# Patient Record
Sex: Female | Born: 1945 | Race: White | Hispanic: No | Marital: Married | Smoking: Never smoker
Health system: Southern US, Community
[De-identification: ages and names within clinical notes are randomized; demographics above are authoritative.]

## PROBLEM LIST (undated history)

## (undated) DIAGNOSIS — T8859XA Other complications of anesthesia, initial encounter: Secondary | ICD-10-CM

## (undated) DIAGNOSIS — H269 Unspecified cataract: Secondary | ICD-10-CM

## (undated) DIAGNOSIS — H6690 Otitis media, unspecified, unspecified ear: Secondary | ICD-10-CM

## (undated) DIAGNOSIS — T4145XA Adverse effect of unspecified anesthetic, initial encounter: Secondary | ICD-10-CM

## (undated) DIAGNOSIS — M1990 Unspecified osteoarthritis, unspecified site: Secondary | ICD-10-CM

## (undated) DIAGNOSIS — K219 Gastro-esophageal reflux disease without esophagitis: Secondary | ICD-10-CM

## (undated) DIAGNOSIS — F329 Major depressive disorder, single episode, unspecified: Secondary | ICD-10-CM

## (undated) DIAGNOSIS — R51 Headache: Secondary | ICD-10-CM

## (undated) DIAGNOSIS — I10 Essential (primary) hypertension: Secondary | ICD-10-CM

## (undated) DIAGNOSIS — F419 Anxiety disorder, unspecified: Secondary | ICD-10-CM

## (undated) DIAGNOSIS — R011 Cardiac murmur, unspecified: Secondary | ICD-10-CM

## (undated) DIAGNOSIS — M48061 Spinal stenosis, lumbar region without neurogenic claudication: Secondary | ICD-10-CM

## (undated) HISTORY — PX: ABDOMINAL HYSTERECTOMY: SHX81

## (undated) HISTORY — PX: JOINT REPLACEMENT: SHX530

## (undated) HISTORY — DX: Unspecified cataract: H26.9

---

## 1993-08-22 HISTORY — PX: ABDOMINAL HYSTERECTOMY: SHX81

## 2006-08-23 HISTORY — PX: JOINT REPLACEMENT: SHX530

## 2006-08-27 IMAGING — CR DG CHEST 2V
2 series · 2 of 2 positions shown · non-contrast
Comparison: none

CLINICAL DATA: Preop osteoarthritis of the knee. 
 CHEST - 2 VIEW ([DATE] HOURS):

[w chest pa]
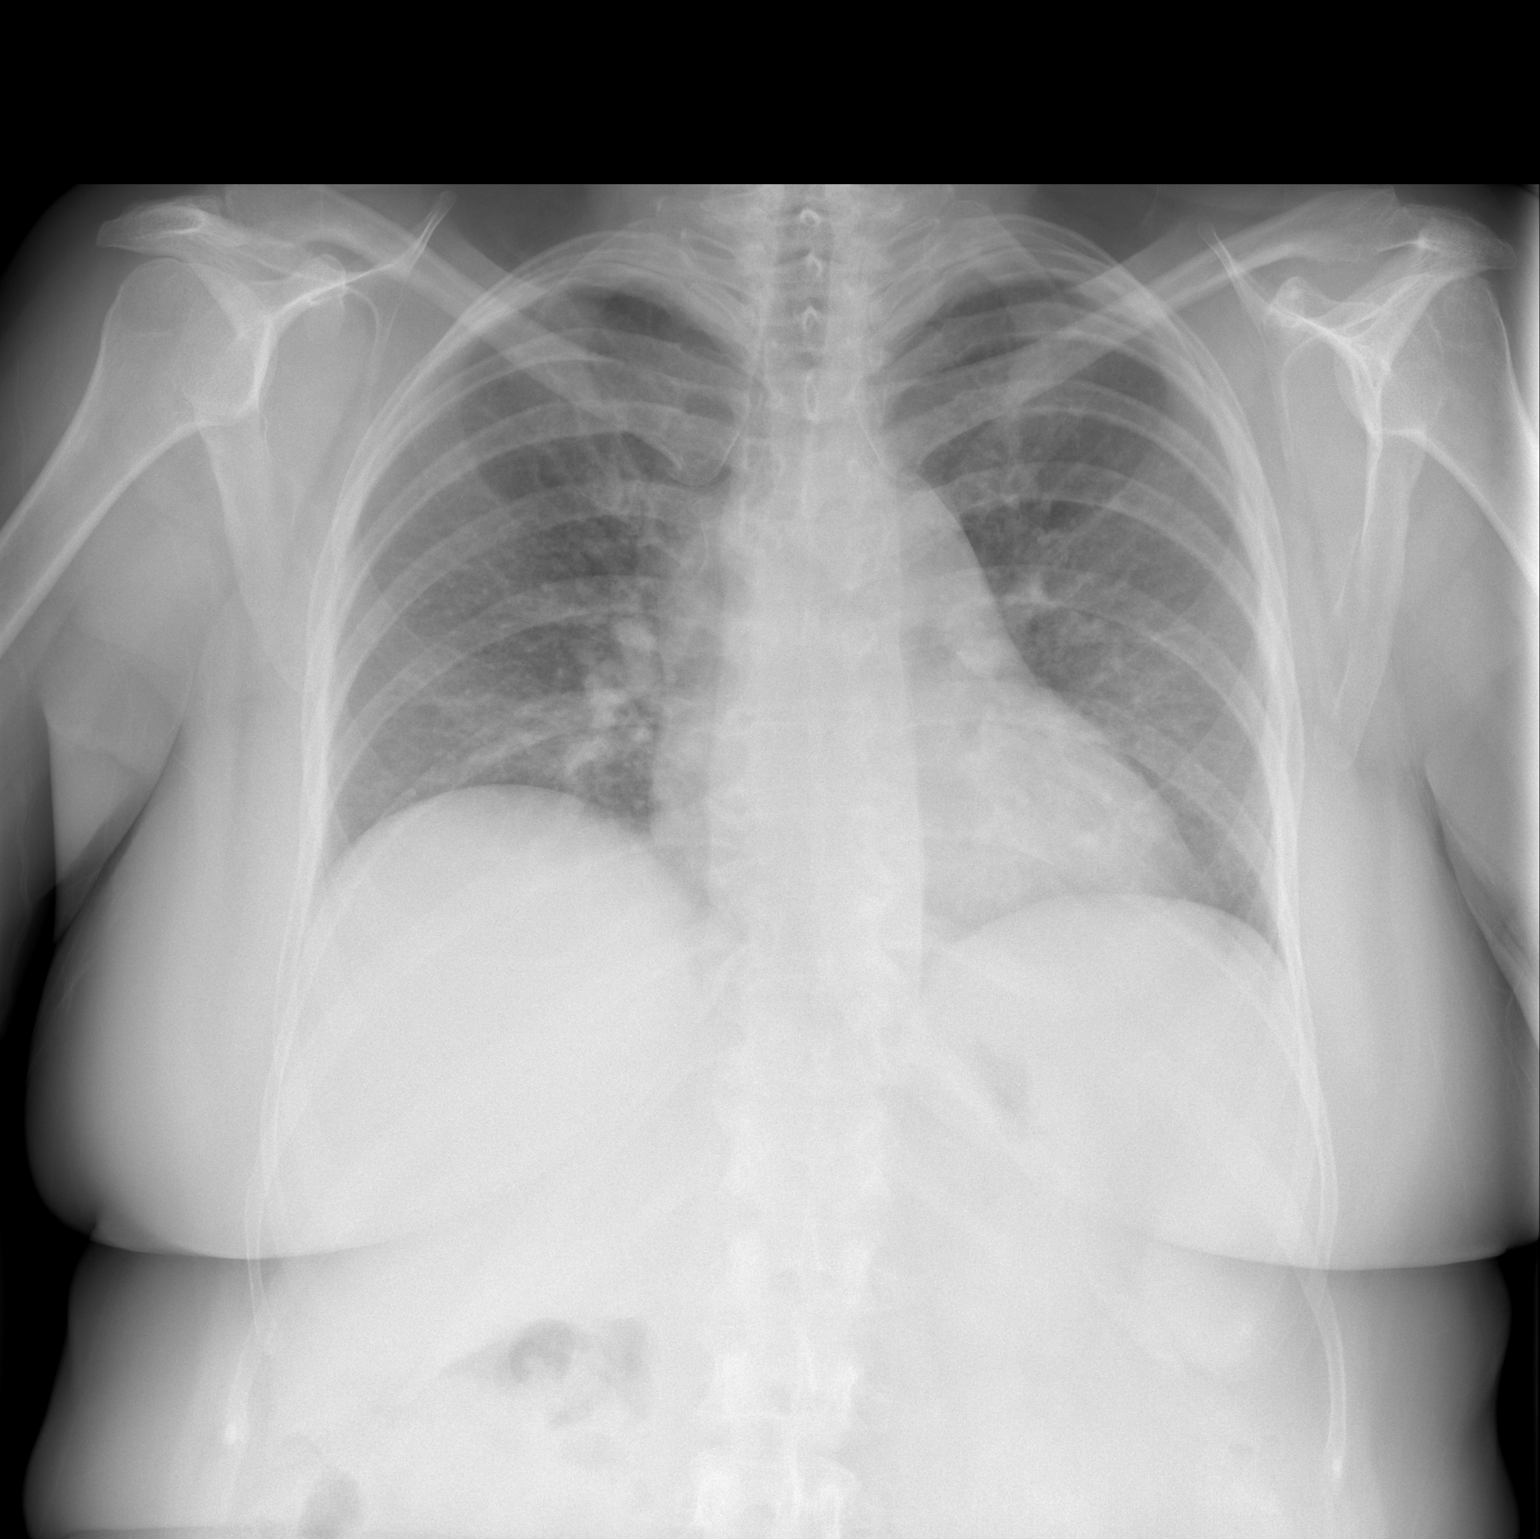

[w chest lat]
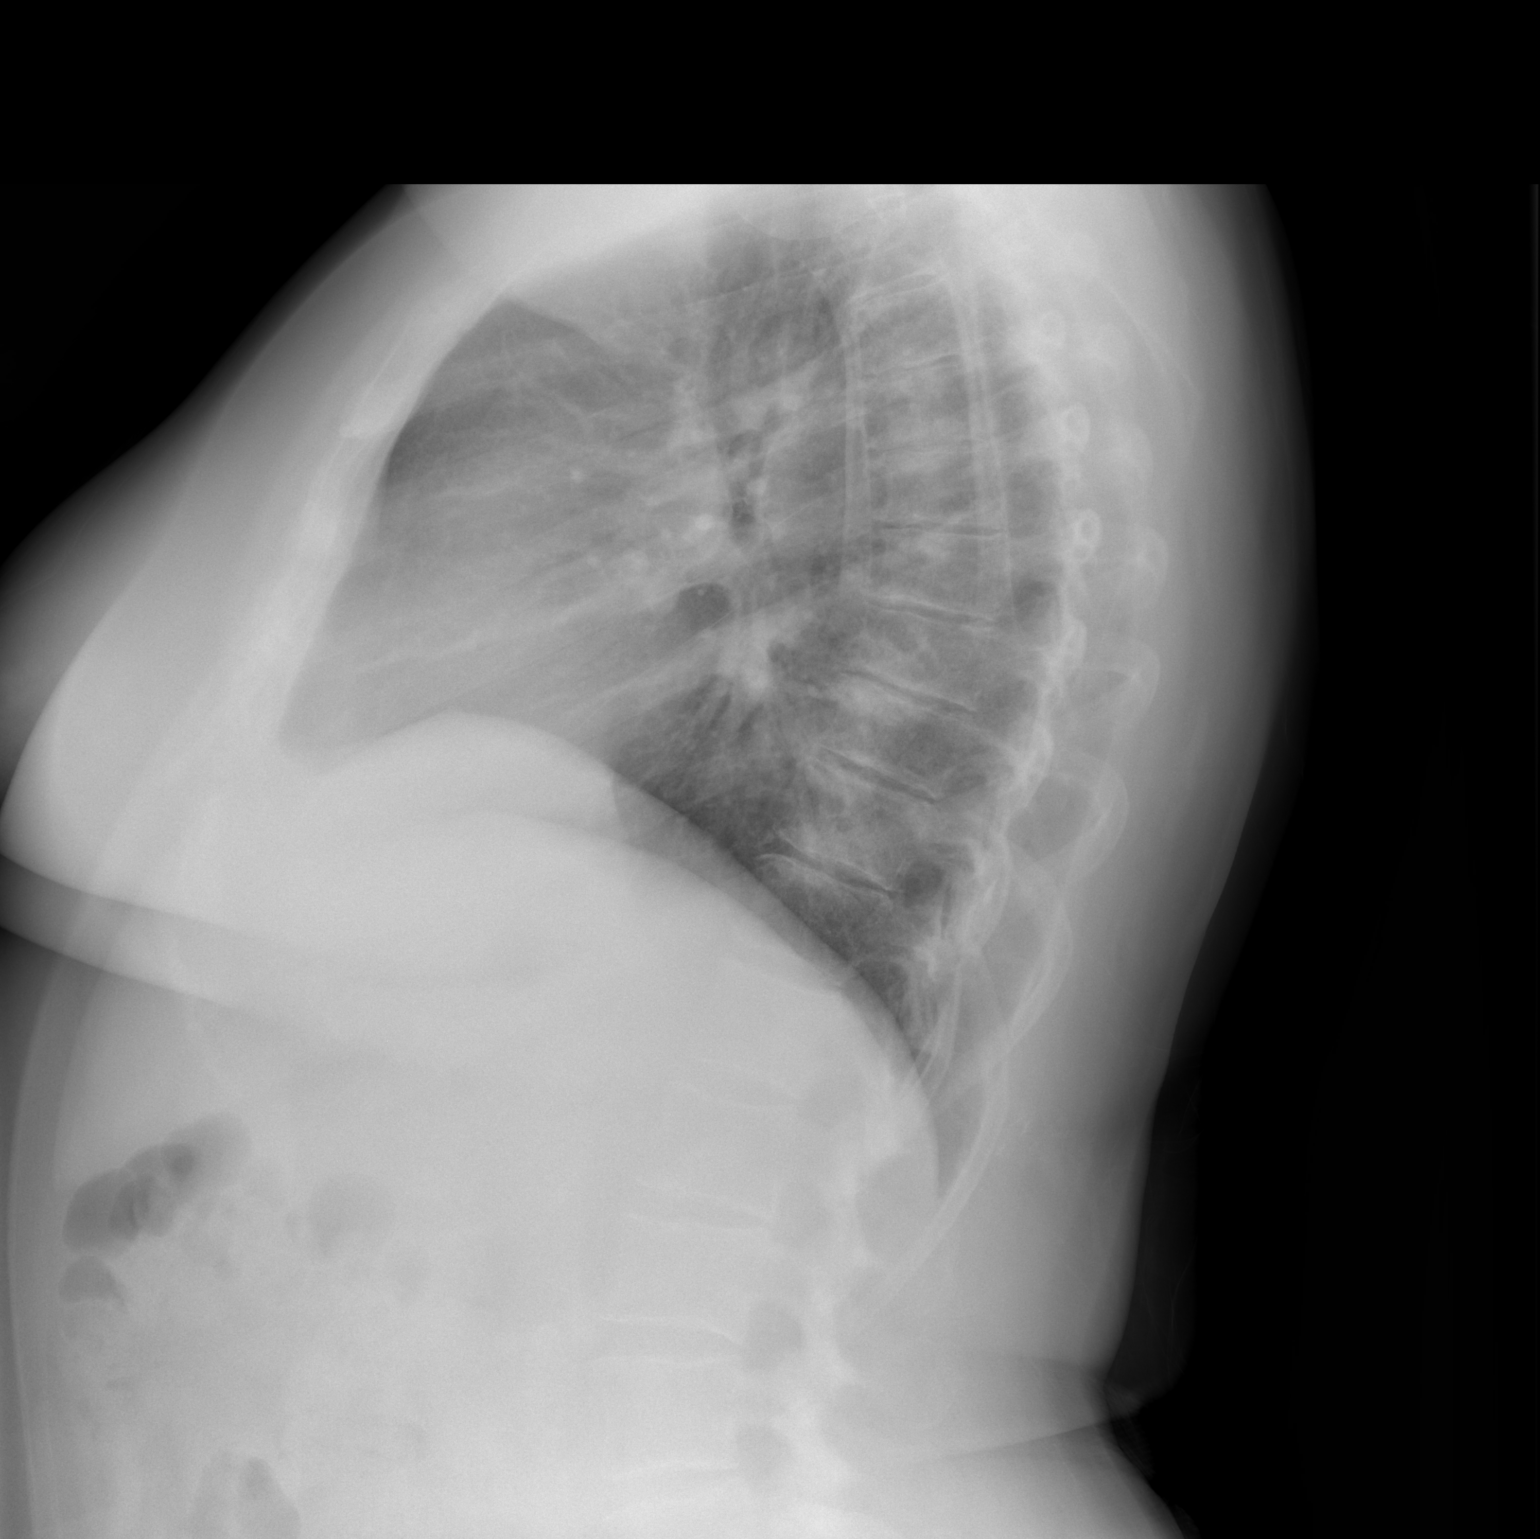

[2 of 2 positions shown; findings below may reference images not displayed]

FINDINGS: Lungs are under inflated and grossly clear.  The heart is mildly enlarged.  Pulmonary vascularity is within normal limits.  No pneumothoraces or effusions are seen.
IMPRESSION: Cardiomegaly without CHF.

## 2006-09-03 ENCOUNTER — Inpatient Hospital Stay (HOSPITAL_COMMUNITY): Admission: RE | Admit: 2006-09-03 | Discharge: 2006-09-10 | Payer: Self-pay | Admitting: Orthopedic Surgery

## 2006-09-04 ENCOUNTER — Ambulatory Visit: Payer: Self-pay | Admitting: Physical Medicine & Rehabilitation

## 2011-11-14 DIAGNOSIS — M48061 Spinal stenosis, lumbar region without neurogenic claudication: Secondary | ICD-10-CM | POA: Diagnosis present

## 2011-11-14 NOTE — H&P (Signed)
Diana Arellano 11/13/2011 11:12 AM [facility]: [facility] Patient #: [id number] DOB: April 22, 1946 Married / Language: English / Race: White Female   History of Present Illness(Diana Demeyer J Marcelline Mates, PA-C; 11/13/2011 11:22 AM) The patient is a 66 year old female who comes in today for a preoperative History and Physical. The patient is scheduled for a L3-L5 decompression with possible interbody fusion L4-5 (for spinal stenosis with slip) to be performed by Dr. Debria Garret D. Shon Baton, Diana Arellano at [facility] on Wednesday, November 29, 2011 at 1130am . Please see the hospital record for complete dictated history and physical.    Allergies(Sharon Gillian Shields; 11/13/2011 11:13 AM) SulfADIAZINE *Sulfonamides** Neosporin *OPHTHALMIC AGENTS* NEOSPORIN. 09/13/2006 (Marked as Inactive) SULFA. 06/21/2006 (Marked as Inactive)   Family History(Sharon Gillian Shields; 11/13/2011 11:13 AM) Cancer. grandmother mothers side Cerebrovascular Accident. grandfather mothers side Diabetes Mellitus. grandmother mothers side Osteoarthritis. mother Severe allergy. father   Social History(Sharon Gillian Shields; 11/13/2011 11:13 AM) Alcohol use. current drinker; drinks beer, wine and hard liquor; 8-14 per week Children. 0 Current work status. retired Financial planner (Currently). no Drug/Alcohol Rehab (Previously). no Exercise. Exercises never; does other Illicit drug use. no Living situation. live with spouse Marital status. married Number of flights of stairs before winded. 2-3 Tobacco / smoke exposure. no Tobacco use. never smoker   Medication History(Sharon Gillian Shields; 11/13/2011 11:14 AM) Omeprazole (40MG  Capsule DR, Oral) Active. FLUoxetine HCl (20MG  Capsule, Oral) Active. Simvastatin (40MG  Tablet, Oral) Active. Aspirin (81MG  Tablet, Oral) Active. Gabapentin (100MG  Capsule, Oral) Active. Methocarbamol (500MG  Tablet, Oral) Active. Lisinopril (20MG  Tablet, Oral)  Active. Hydrocodone-Acetaminophen (10-325MG  Tablet, Oral) Active.   Past Surgical History(Sharon Gillian Shields; 11/13/2011 11:13 AM) Hysterectomy. complete (non-cancerous) Total Knee Replacement. bilateral   Other Problems(Sharon Gillian Shields; 11/13/2011 11:13 AM) Anemia Chronic Cystitis Chronic Pain Gastroesophageal Reflux Disease Heart murmur High blood pressure Hypercholesterolemia Migraine Headache Oophorectomy. bilateral Osteoarthritis Unspecified Diagnosis   Review of Systems(Diana Arellano; 11/13/2011 12:04 PM) General:Present- Weight Gain. Not Present- Chills, Fever, Night Sweats, Appetite Loss, Fatigue, Feeling sick and Weight Loss. Skin:Not Present- Itching, Rash, Skin Color Changes, Ulcer, Psoriasis and Change in Hair or Nails. HEENT:Not Present- Sensitivity to light, Hearing problems, Nose Bleed and Ringing in the Ears. Neck:Not Present- Swollen Glands and Neck Mass. Respiratory:Present- Dyspnea. Not Present- Snoring, Chronic Cough and Bloody sputum. Cardiovascular:Present- Shortness of Breath. Not Present- Chest Pain, Swelling of Extremities, Leg Cramps and Palpitations. Female Genitourinary:Not Present- Blood in Urine, Menstrual Irregularities, Frequency, Incontinence and Nocturia. Musculoskeletal:Present- Back Pain. Not Present- Muscle Weakness, Muscle Pain, Joint Stiffness, Joint Swelling and Joint Pain. Neurological:Not Present- Tingling, Numbness, Burning, Tremor, Headaches and Dizziness. Psychiatric:Present- Depression. Not Present- Anxiety and Memory Loss. Endocrine:Present- Heat Intolerance. Not Present- Cold Intolerance, Excessive hunger and Excessive Thirst. Hematology:Not Present- Abnormal Bleeding, Anemia, Blood Clots and Easy Bruising.   Vitals(Sharon Gillian Shields; 11/13/2011 11:16 AM) 11/13/2011 11:14 AM Weight: 226 lb Height: 77 in Body Surface Area: 2.36 m Body Mass Index: 26.8 kg/m Pulse: 75 (Regular) BP: 143/92 (Sitting,  Left Arm, Standard)    Physical Exam(Diana Tiedt J Alantis Bethune, PA-C; 11/13/2011 4:38 PM) The physical exam findings are as follows:   General General Appearance- pleasant. Not in acute distress. Orientation- Oriented X3. Build & Nutrition- Well nourished and Well developed. Posture- Normal posture. Gait- Normal. Mental Status- Alert.   Integumentary Lumbar Spine- Skin examination of the lumbar spine is without deformity, skin lesions, lacerations or abrasions.   Head and Neck Neck Global Assessment- supple. no lymphadenopathy and no nucchal rigidty.   Eye Pupil-  Bilateral- Normal, Direct reaction to light normal, Equal and Regular. Motion- Bilateral- EOMI.   Chest and Lung Exam Auscultation: Breath sounds:- Clear.   Cardiovascular Auscultation:Rhythm- Regular rate and rhythm. Heart Sounds- Normal heart sounds.   Abdomen Palpation/Percussion:Palpation and Percussion of the abdomen reveal - Non Tender, No Rebound tenderness and Soft.   Peripheral Vascular Lower Extremity:Inspection- Bilateral- Inspection Normal. Palpation:Posterior tibial pulse- Bilateral- 2+. Dorsalis pedis pulse- Bilateral- 2+.   Neurologic Sensation:Lower Extremity- Bilateral- sensation is intact in the lower extremity. Reflexes:Patellar Reflex- Bilateral- 0 (unequivocal). Achilles Reflex- Bilateral- 1+. Babinski- Bilateral- Babinski not present. Clonus- Bilateral- clonus not present. Testing:Seated Straight Leg Raise- Bilateral- Seated straight leg raise negative.   Musculoskeletal Spine/Ribs/Pelvis Lumbosacral Spine:Inspection and Palpation- Tenderness- generalized. bony and soft tissue palpation of the lumbar spine and SI joint does not recreate their typical pain. Strength and Tone: Strength:Hip Flexion- Bilateral- 5/5. Knee Extension- Bilateral- 5/5. Knee Flexion- Bilateral- 5/5. Ankle Dorsiflexion- Bilateral- 5/5. Ankle  Plantarflexion- Bilateral- 5/5. Heel walk- Bilateral- able to heel walk without difficulty. Toe Walk- Bilateral- able to walk on toes without difficulty. Heel-Toe Walk- Bilateral- able to heel-toe walk without difficulty. ROM- Flexion- mildly decreased range of motion and painful. Extension- mildly decreased range of motion and painful. Pain:- neither flexion or extension is more painful than the other. Waddell's Signs- no Waddell's signs present. Lower Extremity Range of Motion:- No true hip, knee or ankle pain with range of motion. Gait and Station:Assistive Devices- no assistive devices.   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 Risks/benefits/alternatives to the procedure/expectations following the procedure have been reviewed with her by Dr. Shon Baton. She understands.  The MRI shows moderate to severe stenosis at L3-4 as well as at L4-5 with a grade 1 borderline 2 slip. L5-S1 has a slight anterolisthesis with severe facet arthrosis, right side is worse , but this does not cause neural displacement.  She has not yet completed her pre-op hospital requirements and is scheduled to do so on 11/21/11. She has be fitted for a lumbar corsett brace with our therapy department and she knows to bring this with her the morning of surgery. She has been medically cleared for surgery by Dr. Collins Scotland (please see the scanned documentation in the office chart for the specifics).  All of her questions have been encouraged, addressed and answered. Plan, at this time is to proceed with surgery as  planned.   Signed electronically by Gwinda Maine, PA-C (11/13/2011 4:39 PM)   Diana Arellano 10/16/2011 2:21 PM Location[facility] Patient #: [id number] DOB: June 22, 1945 Married / Language: [place] / Race: White Female   History of Present Illness(Diana Arellano; 10/16/2011 2:23 PM) The patient is a 66 year old female who presents today for follow up of their back. The patient is being followed for their central back pain. Symptoms reported today include: pain (about the lower lumbar radiating into bilat. lower ext. to the level of the knee/posteriorly.), while the patient does not report symptoms of: weakness or numbness. The patient states that they are doing poorly. Current treatment includes: relative rest, activity modification and pain medications. The following medication has been used for pain control: Hydrocodone. The  patient presents today following MRI.    Subjective Transcription(Diana D BROOKS, Diana Arellano; 10/20/2011 12:32 PM)  She and her husband return for a followup of her MRI . She still has significant low back pain and buttock pain as well as bilateral leg pain. The right is always worse than the left.    Allergies(Diana Arellano; 10/16/2011 2:23 PM) SulfADIAZINE *Sulfonamides** Neosporin *OPHTHALMIC AGENTS* NEOSPORIN. 09/13/2006 (Marked as Inactive) SULFA. 06/21/2006 (Marked as Inactive)   Social History(Diana Arellano; 10/16/2011 2:24 PM) Tobacco use. never smoker   Medication History(Diana Arellano; 10/16/2011 2:24 PM) Hydrocodone-Acetaminophen (10-325MG  Tablet, Oral) Active.   Objective Transcription(Diana D BROOKS, Diana Arellano; 10/20/2011 12:32 PM)  RADIOGRAPHS:  The MRI shows moderate to severe stenosis at L3-4 as well as at L4-5 with a grade 1 borderline 2 slip. L5-S1 has a slight anterolisthesis with severe facet arthrosis, right side is worse , but this does not cause neural displacement.    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 However, with the new MRI I do think the stenosis at L3-4 will also need to be addressed. Therefore, I would recommend more of a central decompression at L3-4, L4-5 and a partial decompression at L5-S1 to address that right facet arthrosis and instrumented fusion at L4-5 to keep the slips from intensifying. This can be accomplished with pedicle screw fixation and possibly interbody fixation if necessary.    I have explained this to the patient and her husband and they are area of the procedure and the indication. They would like to proceed. We will get preoperative medical clearance from her primary care physician. We will proceed at some point next month.    I did review the risk with him to include infection, bleeding, nerve damage, death , stroke, paralysis, blood clots, ongoing or worse pain, loss of bowel and/or bladder control and need for further surgery. The goal of surgery is reduction in her pain and not elimination, to reduce her to a more palatable level of discomfort so her overall quality of life improves. All questions were encouraged and answered. She is expressing understanding of the procedure and the risks.    Miscellaneous Transcription(Diana Sheela Stack, Diana Arellano; 10/20/2011 12:32 PM)  Debria Garret D. Shon Baton, Diana Arellano/jgc    T: 10-18-11  D: 10-16-11    Signed electronically by Alvy Beal, Diana Arellano (10/17/2011 4:40 PM)

## 2011-11-16 ENCOUNTER — Encounter (HOSPITAL_COMMUNITY): Payer: Self-pay | Admitting: Pharmacy Technician

## 2011-11-21 ENCOUNTER — Encounter (HOSPITAL_COMMUNITY): Payer: Self-pay

## 2011-11-21 ENCOUNTER — Encounter (HOSPITAL_COMMUNITY)
Admission: RE | Admit: 2011-11-21 | Discharge: 2011-11-21 | Disposition: A | Discharge status: Home / Self Care | Payer: Medicare Other | Source: Ambulatory Visit | Attending: Physician Assistant | Admitting: Physician Assistant

## 2011-11-21 ENCOUNTER — Encounter (HOSPITAL_COMMUNITY)
Admission: RE | Admit: 2011-11-21 | Discharge: 2011-11-21 | Disposition: A | Discharge status: Home / Self Care | Payer: Medicare Other | Source: Ambulatory Visit | Attending: Orthopedic Surgery | Admitting: Orthopedic Surgery

## 2011-11-21 DIAGNOSIS — M51379 Other intervertebral disc degeneration, lumbosacral region without mention of lumbar back pain or lower extremity pain: Secondary | ICD-10-CM | POA: Insufficient documentation

## 2011-11-21 DIAGNOSIS — Z01810 Encounter for preprocedural cardiovascular examination: Secondary | ICD-10-CM | POA: Insufficient documentation

## 2011-11-21 DIAGNOSIS — M5137 Other intervertebral disc degeneration, lumbosacral region: Secondary | ICD-10-CM | POA: Insufficient documentation

## 2011-11-21 DIAGNOSIS — Z01812 Encounter for preprocedural laboratory examination: Secondary | ICD-10-CM | POA: Insufficient documentation

## 2011-11-21 DIAGNOSIS — Z01818 Encounter for other preprocedural examination: Secondary | ICD-10-CM | POA: Insufficient documentation

## 2011-11-21 DIAGNOSIS — M5126 Other intervertebral disc displacement, lumbar region: Secondary | ICD-10-CM | POA: Insufficient documentation

## 2011-11-21 HISTORY — DX: Major depressive disorder, single episode, unspecified: F32.9

## 2011-11-21 HISTORY — DX: Anxiety disorder, unspecified: F41.9

## 2011-11-21 HISTORY — DX: Unspecified osteoarthritis, unspecified site: M19.90

## 2011-11-21 HISTORY — DX: Essential (primary) hypertension: I10

## 2011-11-21 HISTORY — DX: Adverse effect of unspecified anesthetic, initial encounter: T41.45XA

## 2011-11-21 HISTORY — DX: Gastro-esophageal reflux disease without esophagitis: K21.9

## 2011-11-21 HISTORY — DX: Other complications of anesthesia, initial encounter: T88.59XA

## 2011-11-21 LAB — COMPREHENSIVE METABOLIC PANEL
ALT: 25 U/L (ref 0–35)
AST: 24 U/L (ref 0–37)
Albumin: 4 g/dL (ref 3.5–5.2)
Alkaline Phosphatase: 120 U/L — ABNORMAL HIGH (ref 39–117)
BUN: 19 mg/dL (ref 6–23)
CO2: 28 mEq/L (ref 19–32)
Calcium: 9.6 mg/dL (ref 8.4–10.5)
Chloride: 102 mEq/L (ref 96–112)
Creatinine, Ser: 0.76 mg/dL (ref 0.50–1.10)
GFR calc Af Amer: >90 mL/min (ref >90)
GFR calc non Af Amer: 87 mL/min — ABNORMAL LOW (ref >90)
Glucose, Bld: 95 mg/dL (ref 70–99)
Potassium: 3.9 mEq/L (ref 3.5–5.1)
Sodium: 140 mEq/L (ref 135–145)
Total Bilirubin: 0.4 mg/dL (ref 0.3–1.2)
Total Protein: 6.9 g/dL (ref 6.0–8.3)

## 2011-11-21 LAB — PROTIME-INR
INR: 1.1 (ref 0.00–1.49)
Prothrombin Time: 14.4 seconds (ref 11.6–15.2)

## 2011-11-21 LAB — TYPE AND SCREEN
ABO/RH(D): A POS
Antibody Screen: NEGATIVE

## 2011-11-21 LAB — CBC
HCT: 38.6 % (ref 36.0–46.0)
Hemoglobin: 13.1 g/dL (ref 12.0–15.0)
MCH: 31.3 pg (ref 26.0–34.0)
MCHC: 33.9 g/dL (ref 30.0–36.0)
MCV: 92.3 fL (ref 78.0–100.0)
Platelets: 151 10*3/uL (ref 150–400)
RBC: 4.18 MIL/uL (ref 3.87–5.11)
RDW: 13 % (ref 11.5–15.5)
WBC: 5.7 10*3/uL (ref 4.0–10.5)

## 2011-11-21 LAB — SURGICAL PCR SCREEN
MRSA, PCR: NEGATIVE
Staphylococcus aureus: NEGATIVE

## 2011-11-21 LAB — ABO/RH: ABO/RH(D): A POS

## 2011-11-21 IMAGING — CR DG LUMBAR SPINE 2-3V
2 series · 2 of 2 positions shown · non-contrast
Comparison: None available

CLINICAL DATA: Preop for lumbar laminectomy

LUMBAR SPINE - 2-3 VIEW

[view not recorded (1 of 2)]
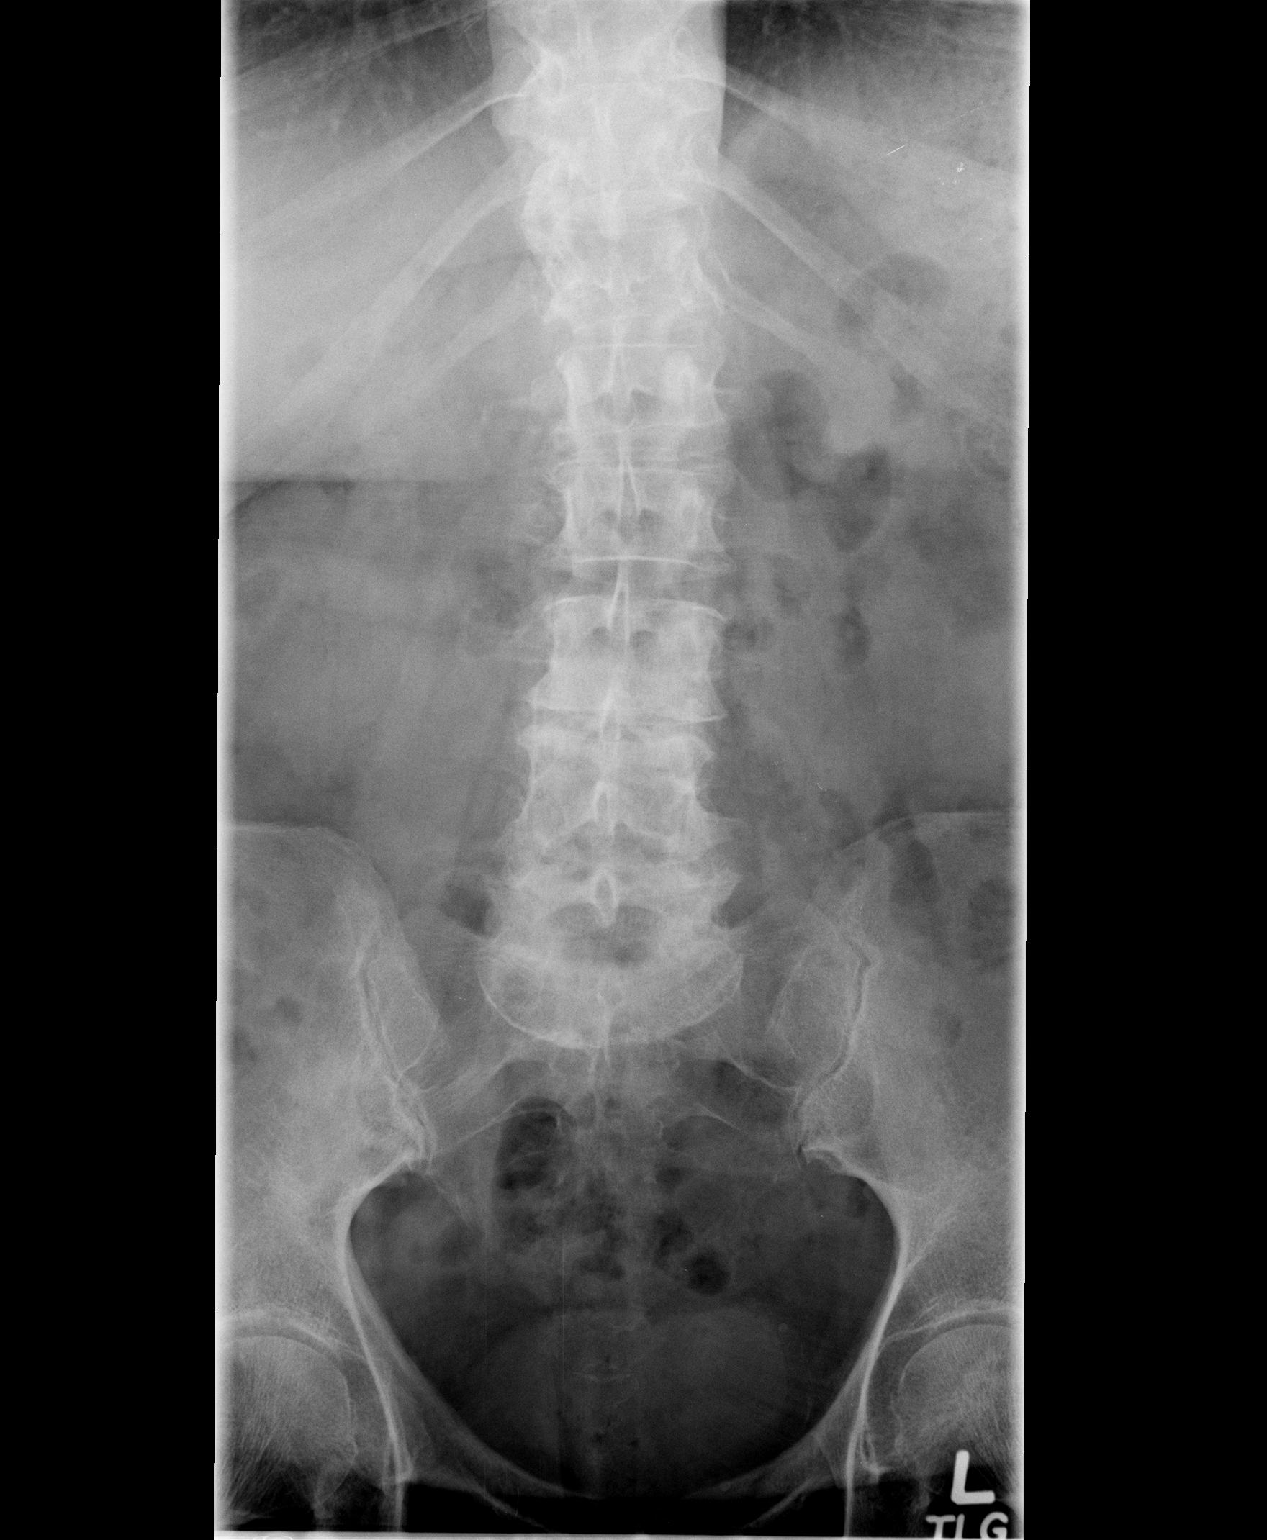

[view not recorded (2 of 2)]
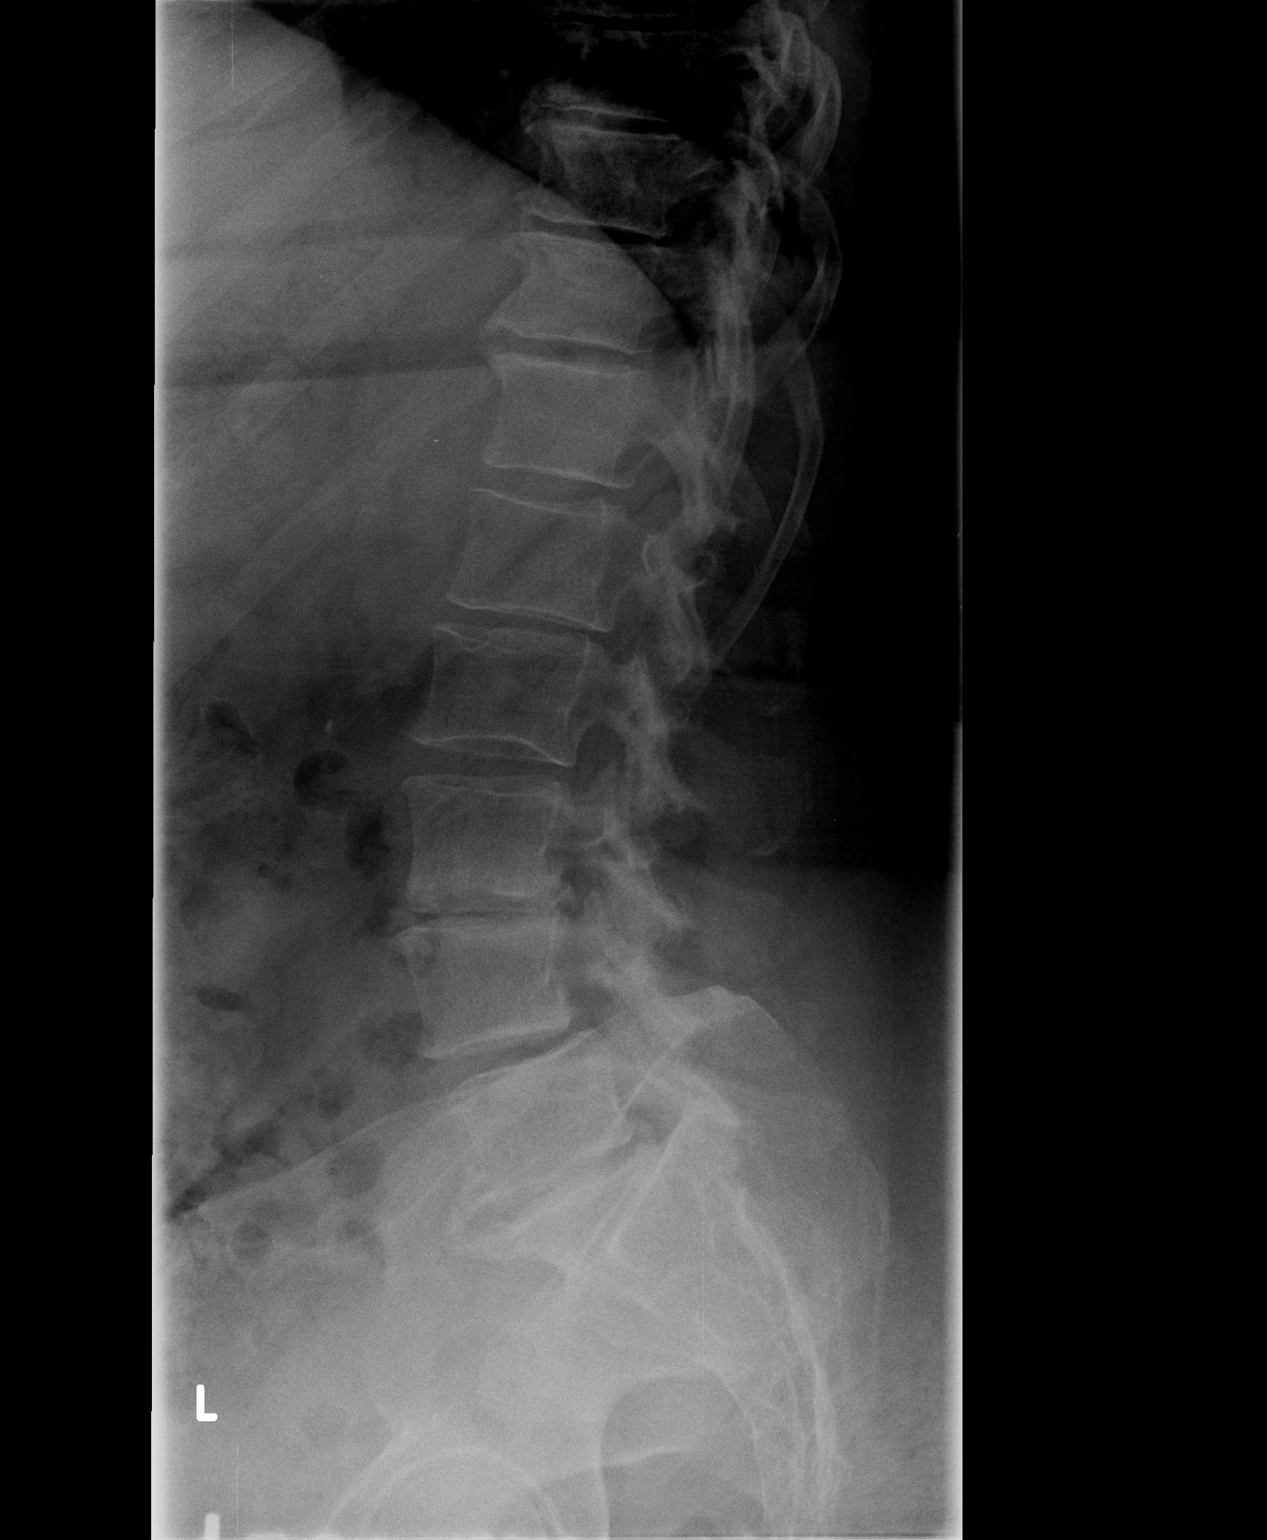

[2 of 2 positions shown; findings below may reference images not displayed]

FINDINGS: There are five non-rib bearing lumbar-type vertebral
bodies.  Vertebral body levels are labeled on these images.

There is approximately 7 mm anterolisthesis of L4 on L5.  Remainder
of the vertebral bodies are normally aligned.  Vertebral body
heights are maintained.  There is disc space narrowing at L3-4 and
L5-S1. No obvious pars defects on this two-view study; evaluation
for pars defects is limited without oblique views.

Anterior osteophyte formation is present at L5-S1.  Facet joint
degenerative changes are seen at L2-L5.  Sacroiliac joints appear
normal.  No acute bony abnormality.
IMPRESSION: 1. Degenerative disc disease, most prominent at L3-4 and L5-S1 and
multilevel facet joint degenerative change.
 2.  Grade 1 anterolisthesis of L4 on L5.

## 2011-11-21 NOTE — Progress Notes (Signed)
Ekg,cxr req. From dr Yehuda Budd

## 2011-11-21 NOTE — Pre-Procedure Instructions (Signed)
20 Diana Arellano  11/21/2011   Your procedure is scheduled on:  11/29/11  Report to [facility] at 930 AM.  Call this number if you have problems the morning of surgery: [phone]   Remember:   Do not eat food:After Midnight.  May have clear liquids:until Midnight .  Clear liquids include soda, tea, black coffee, apple or grape juice, broth.  Take these medicines the morning of surgery with A SIP OF WATER: prozac,neurotin,hydrocodone,robaxan,prilosec   Do not wear jewelry, make-up or nail polish.  Do not wear lotions, powders, or perfumes. You may wear deodorant.  Do not shave 48 hours prior to surgery. Men may shave face and neck.  Do not bring valuables to the hospital.  Contacts, dentures or bridgework may not be worn into surgery.  Leave suitcase in the car. After surgery it may be brought to your room.  For patients admitted to the hospital, checkout time is 11:00 AM the day of discharge.   Patients discharged the day of surgery will not be allowed to drive home.  Name and phone number of your driver: family  Special Instructions: CHG Shower Use Special Wash: 1/2 bottle night before surgery and 1/2 bottle morning of surgery.   Please read over the following fact sheets that you were given: Pain Booklet, Coughing and Deep Breathing, Blood Transfusion Information, MRSA Information and Surgical Site Infection Prevention

## 2011-11-28 MED ORDER — CEFAZOLIN SODIUM-DEXTROSE 2-3 GM-% IV SOLR
2.0000 g | INTRAVENOUS | Status: AC
  Administered 2011-12-28 (×2): 2 g via INTRAVENOUS

## 2011-11-28 MED ORDER — LACTATED RINGERS IV SOLN: INTRAVENOUS | Status: DC

## 2011-12-14 ENCOUNTER — Encounter (HOSPITAL_COMMUNITY): Payer: Self-pay

## 2011-12-14 ENCOUNTER — Encounter (HOSPITAL_COMMUNITY)
Admission: RE | Admit: 2011-12-14 | Discharge: 2011-12-14 | Disposition: A | Discharge status: Home / Self Care | Payer: Medicare Other | Source: Ambulatory Visit | Attending: Orthopedic Surgery | Admitting: Orthopedic Surgery

## 2011-12-14 DIAGNOSIS — M4316 Spondylolisthesis, lumbar region: Secondary | ICD-10-CM

## 2011-12-14 HISTORY — DX: Headache: R51

## 2011-12-14 HISTORY — DX: Cardiac murmur, unspecified: R01.1

## 2011-12-14 LAB — CBC
HCT: 40.5 % (ref 36.0–46.0)
Hemoglobin: 13.8 g/dL (ref 12.0–15.0)
MCH: 31.2 pg (ref 26.0–34.0)
MCHC: 34.1 g/dL (ref 30.0–36.0)
MCV: 91.6 fL (ref 78.0–100.0)
Platelets: 157 10*3/uL (ref 150–400)
RBC: 4.42 MIL/uL (ref 3.87–5.11)
RDW: 13 % (ref 11.5–15.5)
WBC: 6.3 10*3/uL (ref 4.0–10.5)

## 2011-12-14 LAB — COMPREHENSIVE METABOLIC PANEL
ALT: 23 U/L (ref 0–35)
AST: 27 U/L (ref 0–37)
Albumin: 4 g/dL (ref 3.5–5.2)
Alkaline Phosphatase: 124 U/L — ABNORMAL HIGH (ref 39–117)
BUN: 14 mg/dL (ref 6–23)
CO2: 30 mEq/L (ref 19–32)
Calcium: 9.8 mg/dL (ref 8.4–10.5)
Chloride: 103 mEq/L (ref 96–112)
Creatinine, Ser: 0.84 mg/dL (ref 0.50–1.10)
GFR calc Af Amer: 83 mL/min — ABNORMAL LOW (ref >90)
GFR calc non Af Amer: 71 mL/min — ABNORMAL LOW (ref >90)
Glucose, Bld: 102 mg/dL — ABNORMAL HIGH (ref 70–99)
Potassium: 4.3 mEq/L (ref 3.5–5.1)
Sodium: 142 mEq/L (ref 135–145)
Total Bilirubin: 0.4 mg/dL (ref 0.3–1.2)
Total Protein: 7.2 g/dL (ref 6.0–8.3)

## 2011-12-14 LAB — PROTIME-INR
INR: 1.11 (ref 0.00–1.49)
Prothrombin Time: 14.5 seconds (ref 11.6–15.2)

## 2011-12-14 LAB — TYPE AND SCREEN
ABO/RH(D): A POS
Antibody Screen: NEGATIVE

## 2011-12-14 NOTE — Progress Notes (Addendum)
Contacted Dr. Alda Berthold office [phone], spoke with Casimer Bilis requested most recent EKG and CXR.

## 2011-12-14 NOTE — Pre-Procedure Instructions (Signed)
20 Diana Arellano  12/14/2011   Your procedure is scheduled on:  Thursday December 21, 2011  Report to [facility] at 8:30 AM.  Call this number if you have problems the morning of surgery: [phone]   Remember:   Do not eat food or drink:After Midnight.      Take these medicines the morning of surgery with A SIP OF WATER: prozac, gabapentin, norco, methocarbamol, prilosec,   Do not wear jewelry, make-up or nail polish.  Do not wear lotions, powders, or perfumes. You may wear deodorant.  Do not shave 48 hours prior to surgery. Men may shave face and neck.  Do not bring valuables to the hospital.  Contacts, dentures or bridgework may not be worn into surgery.  Leave suitcase in the car. After surgery it may be brought to your room.  For patients admitted to the hospital, checkout time is 11:00 AM the day of discharge.   Patients discharged the day of surgery will not be allowed to drive home.  Name and phone number of your driver: Avie Echevaria, husband  Special Instructions: CHG Shower Use Special Wash: 1/2 bottle night before surgery and 1/2 bottle morning of surgery.   Please read over the following fact sheets that you were given: Pain Booklet, Coughing and Deep Breathing, Blood Transfusion Information, MRSA Information and Surgical Site Infection Prevention

## 2011-12-24 HISTORY — PX: BACK SURGERY: SHX140

## 2011-12-27 MED ORDER — CEFAZOLIN SODIUM-DEXTROSE 2-3 GM-% IV SOLR: 2.0000 g | INTRAVENOUS | Status: DC

## 2011-12-27 NOTE — Progress Notes (Signed)
I spoke with Diana Arellano, she denies any change in health history since PAT visit. Pt states she is still wearing blood bank bracelet.  I instructed pat of arrival time of 0530 and NPO after midnight and what medications she is allowed to take in the am.

## 2011-12-28 ENCOUNTER — Encounter (HOSPITAL_COMMUNITY): Payer: Self-pay | Admitting: *Deleted

## 2011-12-28 ENCOUNTER — Inpatient Hospital Stay (HOSPITAL_COMMUNITY)
Admission: RE | Admit: 2011-12-28 | Discharge: 2011-12-31 | DRG: 460 | Disposition: A | Discharge status: Home / Self Care | Payer: Medicare Other | Source: Ambulatory Visit | Attending: Orthopedic Surgery | Admitting: Orthopedic Surgery

## 2011-12-28 ENCOUNTER — Encounter (HOSPITAL_COMMUNITY)
Admission: RE | Disposition: A | Discharge status: Home / Self Care | Payer: Self-pay | Source: Ambulatory Visit | Attending: Orthopedic Surgery

## 2011-12-28 ENCOUNTER — Inpatient Hospital Stay (HOSPITAL_COMMUNITY): Payer: Medicare Other | Admitting: Anesthesiology

## 2011-12-28 ENCOUNTER — Encounter (HOSPITAL_COMMUNITY): Payer: Self-pay | Admitting: Anesthesiology

## 2011-12-28 ENCOUNTER — Inpatient Hospital Stay (HOSPITAL_COMMUNITY): Payer: Medicare Other

## 2011-12-28 DIAGNOSIS — F329 Major depressive disorder, single episode, unspecified: Secondary | ICD-10-CM | POA: Diagnosis present

## 2011-12-28 DIAGNOSIS — F411 Generalized anxiety disorder: Secondary | ICD-10-CM | POA: Diagnosis present

## 2011-12-28 DIAGNOSIS — F3289 Other specified depressive episodes: Secondary | ICD-10-CM | POA: Diagnosis present

## 2011-12-28 DIAGNOSIS — M4310 Spondylolisthesis, site unspecified: Secondary | ICD-10-CM | POA: Diagnosis present

## 2011-12-28 DIAGNOSIS — Z01811 Encounter for preprocedural respiratory examination: Secondary | ICD-10-CM

## 2011-12-28 DIAGNOSIS — M48061 Spinal stenosis, lumbar region without neurogenic claudication: Principal | ICD-10-CM | POA: Diagnosis present

## 2011-12-28 DIAGNOSIS — I10 Essential (primary) hypertension: Secondary | ICD-10-CM | POA: Diagnosis present

## 2011-12-28 DIAGNOSIS — Z23 Encounter for immunization: Secondary | ICD-10-CM

## 2011-12-28 DIAGNOSIS — K219 Gastro-esophageal reflux disease without esophagitis: Secondary | ICD-10-CM | POA: Diagnosis present

## 2011-12-28 DIAGNOSIS — Z01812 Encounter for preprocedural laboratory examination: Secondary | ICD-10-CM

## 2011-12-28 DIAGNOSIS — M4316 Spondylolisthesis, lumbar region: Secondary | ICD-10-CM

## 2011-12-28 LAB — SURGICAL PCR SCREEN
MRSA, PCR: NEGATIVE
Staphylococcus aureus: NEGATIVE

## 2011-12-28 IMAGING — RF DG C-ARM GT 120 MIN
1 series · 3 of 3 positions shown · non-contrast
Comparison: Fluoroscopy time:  2.37 minutes

CLINICAL DATA: Posterior lumbar spine fusion and decompression.

DG C-ARM GT 120 MIN

[Series 1: run · 3 of 3 slices shown]
[im 1/3]
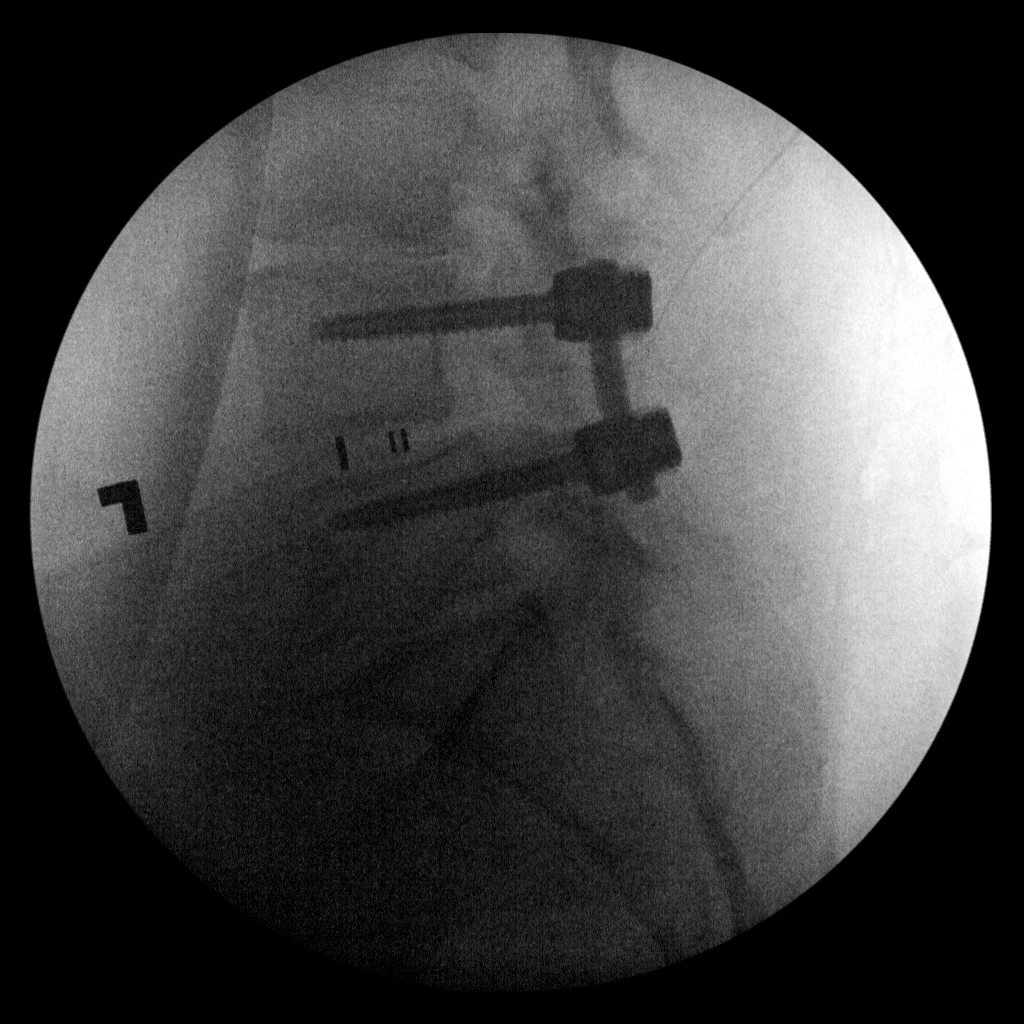
[im 2/3]
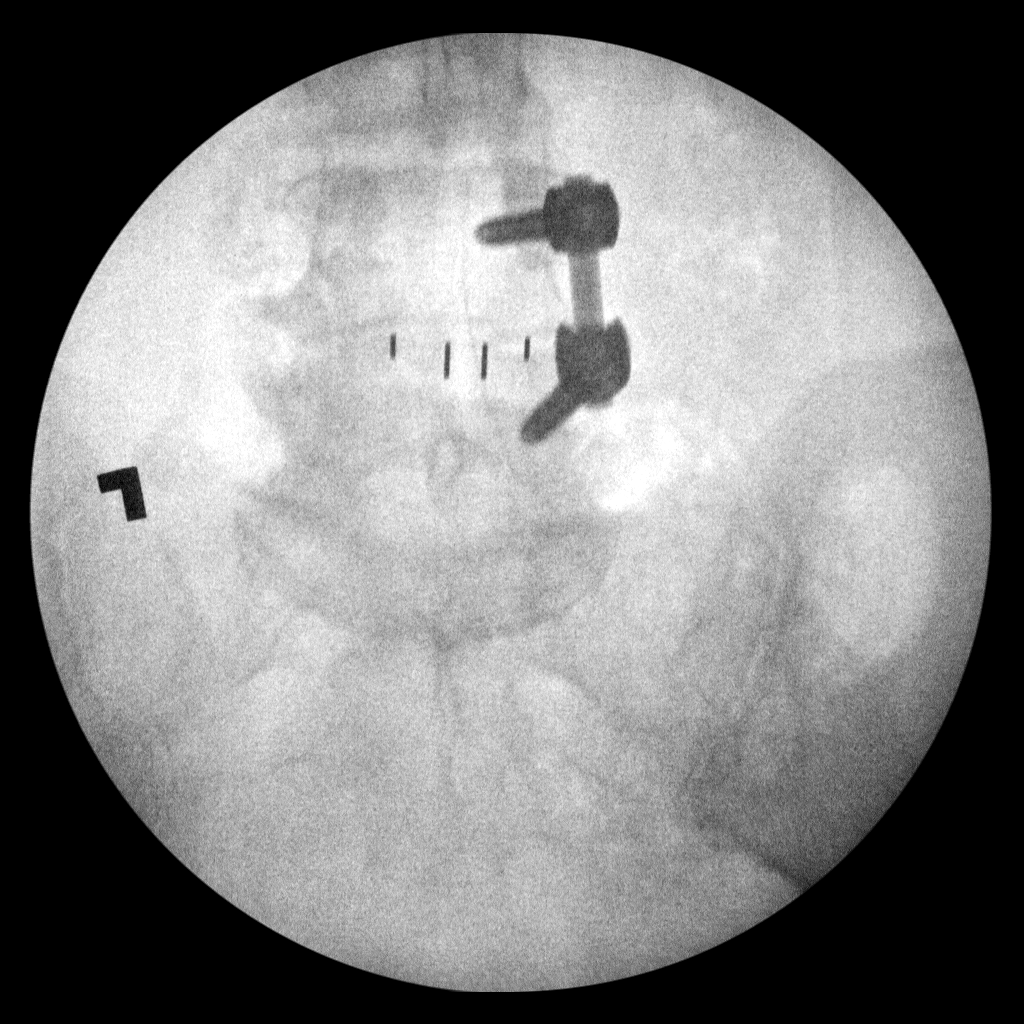
[im 3/3]
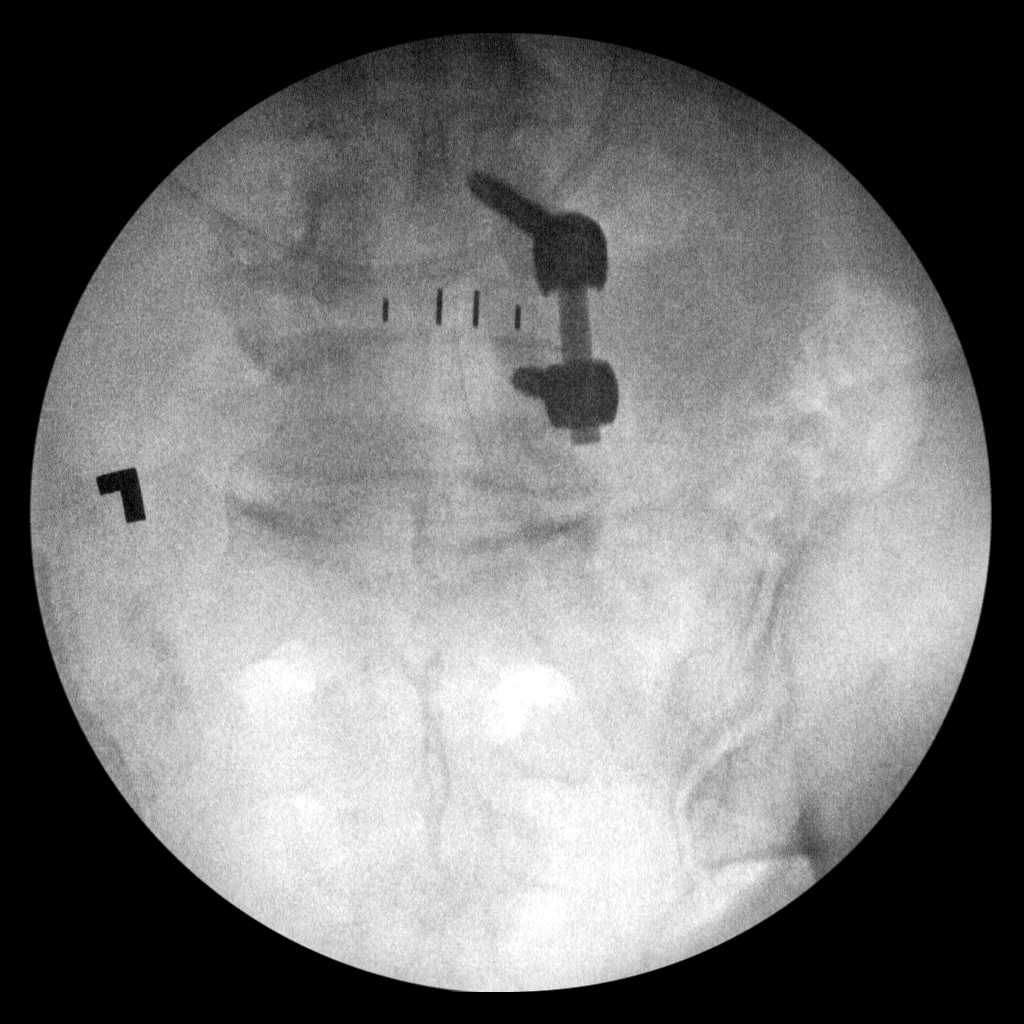

[3 of 3 positions shown; findings below may reference images not displayed]

FINDINGS: Right-sided pedicle screws and a single interconnecting
rod fuse L4-L5, with a disc spacer well-centered between the L4-L5
vertebrae partly maintaining disc height.  The orthopedic hardware
is well seated and aligned.  There is a 5 mm anterolisthesis of L4
on L5. Disc degenerative changes are also noted at L3-L4 and L5-S1.

No evidence of an operative complication.

## 2011-12-28 SURGERY — POSTERIOR LUMBAR FUSION 2 LEVEL
Anesthesia: General | Site: Spine Lumbar | Wound class: Clean

## 2011-12-28 MED ORDER — CEFAZOLIN SODIUM 1-5 GM-% IV SOLN
1.0000 g | Freq: Three times a day (TID) | INTRAVENOUS | Status: AC
  Administered 2011-12-28 (×2): 1 g via INTRAVENOUS
  Filled 2011-12-28 (×2): qty 50

## 2011-12-28 MED ORDER — SODIUM CHLORIDE 0.9 % IJ SOLN
3.0000 mL | Freq: Two times a day (BID) | INTRAMUSCULAR | Status: DC
  Administered 2011-12-28 – 2011-12-30 (×3): 3 mL via INTRAVENOUS

## 2011-12-28 MED ORDER — HYDROMORPHONE HCL PF 1 MG/ML IJ SOLN
0.2500 mg | INTRAMUSCULAR | Status: DC | PRN
  Administered 2011-12-28 (×2): 0.25 mg via INTRAVENOUS
  Administered 2011-12-28: 0.5 mg via INTRAVENOUS

## 2011-12-28 MED ORDER — LISINOPRIL 40 MG PO TABS
40.0000 mg | ORAL_TABLET | Freq: Every day | ORAL | Status: DC
  Administered 2011-12-28 – 2011-12-30 (×3): 40 mg via ORAL
  Filled 2011-12-28 (×4): qty 1

## 2011-12-28 MED ORDER — SUCCINYLCHOLINE CHLORIDE 20 MG/ML IJ SOLN
INTRAMUSCULAR | Status: DC | PRN
  Administered 2011-12-28: 140 mg via INTRAVENOUS

## 2011-12-28 MED ORDER — PNEUMOCOCCAL VAC POLYVALENT 25 MCG/0.5ML IJ INJ
0.5000 mL | INJECTION | INTRAMUSCULAR | Status: AC
  Administered 2011-12-29: 0.5 mL via INTRAMUSCULAR
  Filled 2011-12-28: qty 0.5

## 2011-12-28 MED ORDER — ACETAMINOPHEN 10 MG/ML IV SOLN
INTRAVENOUS | Status: AC
  Filled 2011-12-28: qty 100

## 2011-12-28 MED ORDER — BUPIVACAINE-EPINEPHRINE 0.25% -1:200000 IJ SOLN
INTRAMUSCULAR | Status: DC | PRN
  Administered 2011-12-28: 10 mL

## 2011-12-28 MED ORDER — DEXAMETHASONE SODIUM PHOSPHATE 4 MG/ML IJ SOLN
4.0000 mg | Freq: Four times a day (QID) | INTRAMUSCULAR | Status: DC
  Administered 2011-12-29: 4 mg via INTRAVENOUS
  Filled 2011-12-28 (×15): qty 1

## 2011-12-28 MED ORDER — GABAPENTIN 400 MG PO CAPS
800.0000 mg | ORAL_CAPSULE | Freq: Two times a day (BID) | ORAL | Status: DC
  Administered 2011-12-28 – 2011-12-31 (×6): 800 mg via ORAL
  Filled 2011-12-28 (×9): qty 2

## 2011-12-28 MED ORDER — FLUOXETINE HCL 20 MG PO CAPS
20.0000 mg | ORAL_CAPSULE | Freq: Every day | ORAL | Status: DC
  Administered 2011-12-28 – 2011-12-30 (×3): 20 mg via ORAL
  Filled 2011-12-28 (×4): qty 1

## 2011-12-28 MED ORDER — ONDANSETRON HCL 4 MG/2ML IJ SOLN: 4.0000 mg | INTRAMUSCULAR | Status: DC | PRN

## 2011-12-28 MED ORDER — FLEET ENEMA 7-19 GM/118ML RE ENEM: 1.0000 | ENEMA | Freq: Once | RECTAL | Status: AC | PRN

## 2011-12-28 MED ORDER — MENTHOL 3 MG MT LOZG: 1.0000 | LOZENGE | OROMUCOSAL | Status: DC | PRN

## 2011-12-28 MED ORDER — BUPIVACAINE-EPINEPHRINE PF 0.25-1:200000 % IJ SOLN
INTRAMUSCULAR | Status: AC
  Filled 2011-12-28: qty 30

## 2011-12-28 MED ORDER — PHENOL 1.4 % MT LIQD: 1.0000 | OROMUCOSAL | Status: DC | PRN

## 2011-12-28 MED ORDER — SODIUM CHLORIDE 0.9 % IJ SOLN: 3.0000 mL | INTRAMUSCULAR | Status: DC | PRN

## 2011-12-28 MED ORDER — DEXAMETHASONE SODIUM PHOSPHATE 4 MG/ML IJ SOLN
INTRAMUSCULAR | Status: DC | PRN
  Administered 2011-12-28: 10 mg via INTRAVENOUS

## 2011-12-28 MED ORDER — LACTATED RINGERS IV SOLN: INTRAVENOUS | Status: DC

## 2011-12-28 MED ORDER — CEFAZOLIN SODIUM-DEXTROSE 2-3 GM-% IV SOLR
INTRAVENOUS | Status: AC
  Filled 2011-12-28: qty 50

## 2011-12-28 MED ORDER — DROPERIDOL 2.5 MG/ML IJ SOLN: 0.6250 mg | INTRAMUSCULAR | Status: DC | PRN

## 2011-12-28 MED ORDER — LIDOCAINE HCL (CARDIAC) 20 MG/ML IV SOLN
INTRAVENOUS | Status: DC | PRN
  Administered 2011-12-28: 100 mg via INTRAVENOUS

## 2011-12-28 MED ORDER — PROPOFOL INFUSION 10 MG/ML OPTIME
INTRAVENOUS | Status: DC | PRN
  Administered 2011-12-28: 50 ug/kg/min via INTRAVENOUS

## 2011-12-28 MED ORDER — MUPIROCIN 2 % EX OINT
TOPICAL_OINTMENT | Freq: Once | CUTANEOUS | Status: DC
  Filled 2011-12-28: qty 22

## 2011-12-28 MED ORDER — HEPARIN SODIUM (PORCINE) 1000 UNIT/ML IJ SOLN
INTRAMUSCULAR | Status: AC
  Filled 2011-12-28: qty 1

## 2011-12-28 MED ORDER — ONDANSETRON HCL 4 MG/2ML IJ SOLN
INTRAMUSCULAR | Status: DC | PRN
  Administered 2011-12-28: 4 mg via INTRAVENOUS

## 2011-12-28 MED ORDER — ACETAMINOPHEN 10 MG/ML IV SOLN
INTRAVENOUS | Status: DC | PRN
  Administered 2011-12-28: 1000 mg via INTRAVENOUS

## 2011-12-28 MED ORDER — HEMOSTATIC AGENTS (NO CHARGE) OPTIME
TOPICAL | Status: DC | PRN
  Administered 2011-12-28 (×3): 1 via TOPICAL

## 2011-12-28 MED ORDER — OXYCODONE HCL 5 MG PO TABS: 5.0000 mg | ORAL_TABLET | Freq: Once | ORAL | Status: DC | PRN

## 2011-12-28 MED ORDER — LACTATED RINGERS IV SOLN
INTRAVENOUS | Status: DC
  Administered 2011-12-29: via INTRAVENOUS

## 2011-12-28 MED ORDER — METHOCARBAMOL 100 MG/ML IJ SOLN
500.0000 mg | INTRAMUSCULAR | Status: AC
  Administered 2011-12-28: 500 mg via INTRAVENOUS
  Filled 2011-12-28: qty 5

## 2011-12-28 MED ORDER — 0.9 % SODIUM CHLORIDE (POUR BTL) OPTIME
TOPICAL | Status: DC | PRN
  Administered 2011-12-28 (×3): 1000 mL

## 2011-12-28 MED ORDER — DEXAMETHASONE 4 MG PO TABS
4.0000 mg | ORAL_TABLET | Freq: Four times a day (QID) | ORAL | Status: DC
  Administered 2011-12-28 – 2011-12-31 (×9): 4 mg via ORAL
  Filled 2011-12-28 (×15): qty 1

## 2011-12-28 MED ORDER — SODIUM CHLORIDE 0.9 % IV SOLN: 250.0000 mL | INTRAVENOUS | Status: DC

## 2011-12-28 MED ORDER — THROMBIN 20000 UNITS EX SOLR
CUTANEOUS | Status: DC | PRN
  Administered 2011-12-28: 20000 [IU] via TOPICAL

## 2011-12-28 MED ORDER — THROMBIN 20000 UNITS EX SOLR
CUTANEOUS | Status: AC
  Filled 2011-12-28: qty 20000

## 2011-12-28 MED ORDER — OXYCODONE HCL 5 MG PO TABS
10.0000 mg | ORAL_TABLET | ORAL | Status: DC | PRN
  Administered 2011-12-29 – 2011-12-31 (×9): 10 mg via ORAL
  Filled 2011-12-28 (×9): qty 2

## 2011-12-28 MED ORDER — METHOCARBAMOL 500 MG PO TABS
500.0000 mg | ORAL_TABLET | Freq: Four times a day (QID) | ORAL | Status: DC | PRN
  Administered 2011-12-29 – 2011-12-31 (×5): 500 mg via ORAL
  Filled 2011-12-28 (×6): qty 1

## 2011-12-28 MED ORDER — MIDAZOLAM HCL 5 MG/5ML IJ SOLN
INTRAMUSCULAR | Status: DC | PRN
  Administered 2011-12-28 (×2): 2 mg via INTRAVENOUS

## 2011-12-28 MED ORDER — PANTOPRAZOLE SODIUM 40 MG PO TBEC
40.0000 mg | DELAYED_RELEASE_TABLET | Freq: Every day | ORAL | Status: DC
  Administered 2011-12-29 – 2011-12-30 (×2): 40 mg via ORAL
  Filled 2011-12-28 (×3): qty 1

## 2011-12-28 MED ORDER — FENTANYL CITRATE 0.05 MG/ML IJ SOLN
INTRAMUSCULAR | Status: DC | PRN
  Administered 2011-12-28 (×6): 50 ug via INTRAVENOUS
  Administered 2011-12-28 (×2): 100 ug via INTRAVENOUS
  Administered 2011-12-28: 50 ug via INTRAVENOUS
  Administered 2011-12-28: 100 ug via INTRAVENOUS

## 2011-12-28 MED ORDER — THROMBIN 20000 UNITS EX KIT: PACK | CUTANEOUS | Status: DC | PRN

## 2011-12-28 MED ORDER — LACTATED RINGERS IV SOLN
INTRAVENOUS | Status: DC | PRN
  Administered 2011-12-28 (×4): via INTRAVENOUS

## 2011-12-28 MED ORDER — DIPHENHYDRAMINE HCL 12.5 MG/5ML PO ELIX: 12.5000 mg | ORAL_SOLUTION | Freq: Four times a day (QID) | ORAL | Status: DC | PRN

## 2011-12-28 MED ORDER — MUPIROCIN 2 % EX OINT
TOPICAL_OINTMENT | CUTANEOUS | Status: AC
  Filled 2011-12-28: qty 22

## 2011-12-28 MED ORDER — CEFAZOLIN SODIUM 1-5 GM-% IV SOLN
INTRAVENOUS | Status: AC
  Filled 2011-12-28: qty 100

## 2011-12-28 MED ORDER — NALOXONE HCL 0.4 MG/ML IJ SOLN: 0.4000 mg | INTRAMUSCULAR | Status: DC | PRN

## 2011-12-28 MED ORDER — PROPOFOL 10 MG/ML IV EMUL
INTRAVENOUS | Status: DC | PRN
  Administered 2011-12-28: 40 mg via INTRAVENOUS
  Administered 2011-12-28: 180 mg via INTRAVENOUS
  Administered 2011-12-28: 20 mg via INTRAVENOUS
  Administered 2011-12-28: 40 mg via INTRAVENOUS

## 2011-12-28 MED ORDER — DIPHENHYDRAMINE HCL 50 MG/ML IJ SOLN: 12.5000 mg | Freq: Four times a day (QID) | INTRAMUSCULAR | Status: DC | PRN

## 2011-12-28 MED ORDER — METHOCARBAMOL 100 MG/ML IJ SOLN
500.0000 mg | Freq: Four times a day (QID) | INTRAVENOUS | Status: DC | PRN
  Filled 2011-12-28: qty 5

## 2011-12-28 MED ORDER — SODIUM CHLORIDE 0.9 % IV SOLN
10.0000 mg | INTRAVENOUS | Status: DC | PRN
  Administered 2011-12-28: 20 ug/min via INTRAVENOUS

## 2011-12-28 MED ORDER — SIMVASTATIN 40 MG PO TABS
40.0000 mg | ORAL_TABLET | Freq: Every day | ORAL | Status: DC
  Administered 2011-12-29 – 2011-12-30 (×3): 40 mg via ORAL
  Filled 2011-12-28 (×5): qty 1

## 2011-12-28 MED ORDER — SODIUM CHLORIDE 0.9 % IV SOLN
INTRAVENOUS | Status: DC | PRN
  Administered 2011-12-28: 12:00:00 via INTRAVENOUS

## 2011-12-28 MED ORDER — ZOLPIDEM TARTRATE 5 MG PO TABS
5.0000 mg | ORAL_TABLET | Freq: Every evening | ORAL | Status: DC | PRN
  Filled 2011-12-28: qty 1

## 2011-12-28 MED ORDER — SODIUM CHLORIDE 0.9 % IJ SOLN: 9.0000 mL | INTRAMUSCULAR | Status: DC | PRN

## 2011-12-28 MED ORDER — MORPHINE SULFATE 10 MG/ML IJ SOLN
INTRAMUSCULAR | Status: DC | PRN
  Administered 2011-12-28 (×5): 2 mg via INTRAVENOUS

## 2011-12-28 MED ORDER — ONDANSETRON HCL 4 MG/2ML IJ SOLN: 4.0000 mg | Freq: Four times a day (QID) | INTRAMUSCULAR | Status: DC | PRN

## 2011-12-28 MED ORDER — HYDROMORPHONE 0.3 MG/ML IV SOLN
INTRAVENOUS | Status: DC
  Administered 2011-12-28: via INTRAVENOUS
  Administered 2011-12-28: 2.99 mg via INTRAVENOUS
  Administered 2011-12-28: 1.39 mg via INTRAVENOUS
  Administered 2011-12-28: 14:00:00 via INTRAVENOUS
  Administered 2011-12-29: 2.39 mg via INTRAVENOUS
  Administered 2011-12-29: 0.599 mg via INTRAVENOUS
  Filled 2011-12-28: qty 25

## 2011-12-28 MED ORDER — OXYCODONE HCL 5 MG/5ML PO SOLN: 5.0000 mg | Freq: Once | ORAL | Status: DC | PRN

## 2011-12-28 MED ORDER — PHENYLEPHRINE HCL 10 MG/ML IJ SOLN
INTRAMUSCULAR | Status: DC | PRN
  Administered 2011-12-28: 120 ug via INTRAVENOUS
  Administered 2011-12-28 (×2): 80 ug via INTRAVENOUS
  Administered 2011-12-28: 120 ug via INTRAVENOUS

## 2011-12-28 MED ORDER — DOCUSATE SODIUM 100 MG PO CAPS
100.0000 mg | ORAL_CAPSULE | Freq: Two times a day (BID) | ORAL | Status: DC
  Administered 2011-12-28 – 2011-12-29 (×4): 100 mg via ORAL
  Filled 2011-12-28 (×9): qty 1

## 2011-12-28 SURGICAL SUPPLY — 77 items
BLADE SURG ROTATE 9660 (MISCELLANEOUS) IMPLANT
BUR EGG ELITE 4.0 (BURR) ×2 IMPLANT
CLOTH BEACON ORANGE TIMEOUT ST (SAFETY) ×2 IMPLANT
CLSR STERI-STRIP ANTIMIC 1/2X4 (GAUZE/BANDAGES/DRESSINGS) ×2 IMPLANT
CORDS BIPOLAR (ELECTRODE) ×2 IMPLANT
COVER MAYO STAND STRL (DRAPES) IMPLANT
COVER SURGICAL LIGHT HANDLE (MISCELLANEOUS) ×2 IMPLANT
DERMABOND ADVANCED (GAUZE/BANDAGES/DRESSINGS)
DERMABOND ADVANCED .7 DNX12 (GAUZE/BANDAGES/DRESSINGS) IMPLANT
DRAIN CHANNEL 15F RND FF W/TCR (WOUND CARE) ×2 IMPLANT
DRAPE C-ARM 42X72 X-RAY (DRAPES) ×2 IMPLANT
DRAPE ORTHO SPLIT 77X108 STRL (DRAPES) ×1
DRAPE POUCH INSTRU U-SHP 10X18 (DRAPES) ×2 IMPLANT
DRAPE SURG 17X23 STRL (DRAPES) IMPLANT
DRAPE SURG ORHT 6 SPLT 77X108 (DRAPES) ×1 IMPLANT
DRAPE U-SHAPE 47X51 STRL (DRAPES) ×2 IMPLANT
DRSG ADAPTIC 3X8 NADH LF (GAUZE/BANDAGES/DRESSINGS) IMPLANT
DRSG MEPILEX BORDER 4X4 (GAUZE/BANDAGES/DRESSINGS) ×6 IMPLANT
DRSG MEPILEX BORDER 4X8 (GAUZE/BANDAGES/DRESSINGS) IMPLANT
DURAPREP 26ML APPLICATOR (WOUND CARE) ×2 IMPLANT
ELECT BLADE 4.0 EZ CLEAN MEGAD (MISCELLANEOUS) ×2
ELECT BLADE 6.5 EXT (BLADE) IMPLANT
ELECT CAUTERY BLADE 6.4 (BLADE) IMPLANT
ELECT REM PT RETURN 9FT ADLT (ELECTROSURGICAL) ×2
ELECTRODE BLDE 4.0 EZ CLN MEGD (MISCELLANEOUS) ×1 IMPLANT
ELECTRODE REM PT RTRN 9FT ADLT (ELECTROSURGICAL) ×1 IMPLANT
EVACUATOR SILICONE 100CC (DRAIN) ×2 IMPLANT
GLOVE BIOGEL PI IND STRL 6.5 (GLOVE) ×1 IMPLANT
GLOVE BIOGEL PI IND STRL 8.5 (GLOVE) ×1 IMPLANT
GLOVE BIOGEL PI INDICATOR 6.5 (GLOVE) ×1
GLOVE BIOGEL PI INDICATOR 8.5 (GLOVE) ×1
GLOVE ECLIPSE 6.0 STRL STRAW (GLOVE) ×2 IMPLANT
GLOVE ECLIPSE 8.5 STRL (GLOVE) ×2 IMPLANT
GOWN PREVENTION PLUS XXLARGE (GOWN DISPOSABLE) ×2 IMPLANT
GOWN STRL NON-REIN LRG LVL3 (GOWN DISPOSABLE) ×2 IMPLANT
GUIDEWIRE 1.6X480MM (WIRE) ×6 IMPLANT
IV CATH 14GX2 1/4 (CATHETERS) IMPLANT
KIT BASIN OR (CUSTOM PROCEDURE TRAY) ×2 IMPLANT
KIT ORACLE NEUROMONITING (KITS) ×2 IMPLANT
KIT POSITION SURG JACKSON T1 (MISCELLANEOUS) IMPLANT
KIT ROOM TURNOVER OR (KITS) ×2 IMPLANT
NEEDLE 22X1 1/2 (OR ONLY) (NEEDLE) ×2 IMPLANT
NEEDLE I-PASS III (NEEDLE) ×2 IMPLANT
NEEDLE SPNL 18GX3.5 QUINCKE PK (NEEDLE) IMPLANT
NS IRRIG 1000ML POUR BTL (IV SOLUTION) ×2 IMPLANT
PACK LAMINECTOMY ORTHO (CUSTOM PROCEDURE TRAY) ×2 IMPLANT
PACK UNIVERSAL I (CUSTOM PROCEDURE TRAY) ×2 IMPLANT
PAD ARMBOARD 7.5X6 YLW CONV (MISCELLANEOUS) ×4 IMPLANT
PATTIES SURGICAL .5 X.5 (GAUZE/BANDAGES/DRESSINGS) ×2 IMPLANT
PATTIES SURGICAL .5 X1 (DISPOSABLE) ×4 IMPLANT
PUTTY DBX 5CC (Putty) ×2 IMPLANT
ROD 40MM (Rod) ×1 IMPLANT
ROD SPNL CVD 40X5.5XHRD NS (Rod) ×1 IMPLANT
SCREW MATRIX MIS 6.0X40MM (Screw) ×4 IMPLANT
SPACER CONCORDE CUR 5DEG L8MM (Orthopedic Implant) ×2 IMPLANT
SPONGE LAP 4X18 X RAY DECT (DISPOSABLE) ×12 IMPLANT
SPONGE SURGIFOAM ABS GEL 100 (HEMOSTASIS) ×2 IMPLANT
STRIP CLOSURE SKIN 1/2X4 (GAUZE/BANDAGES/DRESSINGS) IMPLANT
SURGIFLO TRUKIT (HEMOSTASIS) ×4 IMPLANT
SURGIFLO W/THROMBIN 8M KIT (HEMOSTASIS) ×2 IMPLANT
SUT ETHILON 3 0 PS 1 (SUTURE) ×2 IMPLANT
SUT MNCRL AB 3-0 PS2 18 (SUTURE) ×4 IMPLANT
SUT VIC AB 1 CT1 27 (SUTURE) ×2
SUT VIC AB 1 CT1 27XBRD ANBCTR (SUTURE) ×2 IMPLANT
SUT VIC AB 1 CTX 36 (SUTURE)
SUT VIC AB 1 CTX36XBRD ANBCTR (SUTURE) IMPLANT
SUT VIC AB 2-0 CT1 18 (SUTURE) ×4 IMPLANT
SUT VICRYL 0 UR6 27IN ABS (SUTURE) IMPLANT
SYR BULB IRRIGATION 50ML (SYRINGE) ×2 IMPLANT
SYR CONTROL 10ML LL (SYRINGE) ×2 IMPLANT
TAP CANN 5MM (TAP) ×2 IMPLANT
TOWEL OR 17X24 6PK STRL BLUE (TOWEL DISPOSABLE) ×2 IMPLANT
TOWEL OR 17X26 10 PK STRL BLUE (TOWEL DISPOSABLE) ×2 IMPLANT
TRAY FOLEY CATH 14FR (SET/KITS/TRAYS/PACK) ×2 IMPLANT
WATER STERILE IRR 1000ML POUR (IV SOLUTION) IMPLANT
YANKAUER SUCT BULB TIP NO VENT (SUCTIONS) ×2 IMPLANT
matrix locking caps (Cap) ×4 IMPLANT

## 2011-12-28 NOTE — Transfer of Care (Signed)
Immediate Anesthesia Transfer of Care Note  Patient: Diana Arellano  Procedure(s) Performed: Procedure(s) (LRB) with comments: POSTERIOR LUMBAR FUSION 2 LEVEL (N/A) - L3-5 DECOMPRESSION, FUSION L4-5, POSSIBLE INTERBODY FUSION L4-5  Patient Location: PACU  Anesthesia Type: General  Level of Consciousness: awake, alert  and oriented  Airway & Oxygen Therapy: Patient Spontanous Breathing and Patient connected to nasal cannula oxygen  Post-op Assessment: Report given to PACU RN and Patient moving all extremities X 4  Post vital signs: Reviewed and stable  Complications: No apparent anesthesia complications

## 2011-12-28 NOTE — Progress Notes (Signed)
Patient refused mupirocin ointment due to allergy to neosporin.

## 2011-12-28 NOTE — Anesthesia Postprocedure Evaluation (Signed)
  Anesthesia Post-op Note  Patient: Diana Arellano  Procedure(s) Performed: Procedure(s) (LRB) with comments: POSTERIOR LUMBAR FUSION 2 LEVEL (N/A) - L3-5 DECOMPRESSION, FUSION L4-5, POSSIBLE INTERBODY FUSION L4-5  Patient Location: PACU  Anesthesia Type: General  Level of Consciousness: awake, alert  and oriented  Airway and Oxygen Therapy: Patient Spontanous Breathing and Patient connected to nasal cannula oxygen  Post-op Pain: 6 /10  Post-op Assessment: Post-op Vital signs reviewed, Patient's Cardiovascular Status Stable, Respiratory Function Stable, Patent Airway and No signs of Nausea or vomiting  Post-op Vital Signs: Reviewed and stable  Complications: No apparent anesthesia complications

## 2011-12-28 NOTE — Preoperative (Signed)
Beta Blockers   Reason not to administer Beta Blockers:Not Applicable 

## 2011-12-28 NOTE — H&P (Signed)
H=P REVIEWED NO CHANGE TO CLINICAL EXAM

## 2011-12-28 NOTE — H&P (Signed)
Diana Arellano  11/13/2011 11:12 AM  [facility]  Patient #: [id number]  DOB: 07/23/1945  Married / Language: English / Race: White  Female   History of Present Illness   The patient is a 66 year old female who comes in today for a preoperative History and Physical. The patient is scheduled for a L3-L5 decompression with possible interbody fusion L4-5 (for spinal stenosis with slip) to be performed by Dr. Debria Garret D. Shon Baton, MD at [facility] on Thursday, December 28, 2011 at 0730am .   Allergies SulfADIAZINE *Sulfonamides**  Neosporin *OPHTHALMIC AGENTS*  NEOSPORIN. 09/13/2006 (Marked as Inactive)  SULFA. 06/21/2006 (Marked as Inactive)   Family History  Cancer. grandmother mothers side  Cerebrovascular Accident. grandfather mothers side  Diabetes Mellitus. grandmother mothers side  Osteoarthritis. mother  Severe allergy. father   Social History Alcohol use. current drinker; drinks beer, wine and hard liquor; 8-14 per week  Children. 0  Current work status. 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 no  Exercise. Exercises never; does other  Illicit drug use. no  Living situation. live with spouse  Marital status. married  Number of flights of stairs before winded. 2-3  Tobacco / smoke exposure. no  Tobacco use. never smoker   Medication History   Omeprazole (40MG  Capsule DR, Oral) Active.  FLUoxetine HCl (20MG  Capsule, Oral) Active.  Simvastatin (40MG  Tablet, Oral) Active.  Aspirin (81MG  Tablet, Oral) Active.  Gabapentin (100MG  Capsule, Oral) Active.  Methocarbamol (500MG  Tablet, Oral) Active.  Lisinopril (20MG  Tablet, Oral) Active.  Hydrocodone-Acetaminophen (10-325MG  Tablet, Oral) Active.   Past Surgical History  Hysterectomy. complete (non-cancerous)  Total Knee Replacement. bilateral   Other Problems  Anemia  Chronic Cystitis  Chronic Pain  Gastroesophageal Reflux Disease  Heart murmur  High blood  pressure  Hypercholesterolemia  Migraine Headache  Oophorectomy. bilateral  Osteoarthritis  Unspecified Diagnosis   Review of Systems General: Present- Weight Gain. Not Present- Chills, Fever, Night Sweats, Appetite Loss, Fatigue, Feeling sick and Weight Loss.  Skin: Not Present- Itching, Rash, Skin Color Changes, Ulcer, Psoriasis and Change in Hair or Nails.  HEENT: Not Present- Sensitivity to light, Hearing problems, Nose Bleed and Ringing in the Ears.  Neck: Not Present- Swollen Glands and Neck Mass.  Respiratory: Present- Dyspnea. Not Present- Snoring, Chronic Cough and Bloody sputum.  Cardiovascular: Present- Shortness of Breath. Not Present- Chest Pain, Swelling of Extremities, Leg Cramps and Palpitations.  Female Genitourinary: Not Present- Blood in Urine, Menstrual Irregularities, Frequency, Incontinence and Nocturia.  Musculoskeletal: Present- Back Pain. Not Present- Muscle Weakness, Muscle Pain, Joint Stiffness, Joint Swelling and Joint Pain.  Neurological: Not Present- Tingling, Numbness, Burning, Tremor, Headaches and Dizziness.  Psychiatric: Present- Depression. Not Present- Anxiety and Memory Loss.  Endocrine: Present- Heat Intolerance. Not Present- Cold Intolerance, Excessive hunger and Excessive Thirst.  Hematology: Not Present- Abnormal Bleeding, Anemia, Blood Clots and Easy Bruising.   Vitals   Weight: 226 lb Height: 77 in  Body Surface Area: 2.36 m Body Mass Index: 26.8 kg/m  Pulse: 75 (Regular)  BP: 143/92 (Sitting, Left Arm, Standard)   Physical Exam   The physical exam findings are as follows:  General  General Appearance - pleasant. Not in acute distress. Orientation - Oriented X3. Build & Nutrition - Well nourished and Well developed. Posture - Normal posture. Gait - Normal. Mental Status - Alert.  Integumentary  Lumbar Spine - Skin examination of the lumbar spine is without deformity, skin lesions, lacerations or abrasions.  Head  and Neck  Neck  Global  Assessment - supple. no lymphadenopathy and no nucchal rigidty.  Eye  Pupil - Bilateral - Normal, Direct reaction to light normal, Equal and Regular.  Motion - Bilateral - EOMI.  Chest and Lung Exam  Auscultation:  Breath sounds: - Clear.  Cardiovascular  Auscultation: Rhythm - Regular rate and rhythm. Heart Sounds - Normal heart sounds.  Abdomen  Palpation/Percussion: Palpation and Percussion of the abdomen reveal - Non Tender, No Rebound tenderness and Soft.  Peripheral Vascular  Lower Extremity: Inspection - Bilateral - Inspection Normal.  Palpation: Posterior tibial pulse - Bilateral - 2+. Dorsalis pedis pulse - Bilateral - 2+.  Neurologic  Sensation: Lower Extremity - Bilateral - sensation is intact in the lower extremity.  Reflexes: Patellar Reflex - Bilateral - 0 (unequivocal). Achilles Reflex - Bilateral - 1+. Babinski - Bilateral - Babinski not present. Clonus - Bilateral - clonus not present.  Testing: Seated Straight Leg Raise - Bilateral - Seated straight leg raise negative.  Musculoskeletal  Spine/Ribs/Pelvis  Lumbosacral Spine: Inspection and Palpation - Tenderness - generalized. bony and soft tissue palpation of the lumbar spine and SI joint does not recreate their typical pain.  Strength and Tone:  Strength: Hip Flexion - Bilateral - 5/5. Knee Extension - Bilateral - 5/5. Knee Flexion - Bilateral - 5/5. Ankle Dorsiflexion - Bilateral - 5/5. Ankle Plantarflexion - Bilateral - 5/5. Heel walk - Bilateral - able to heel walk without difficulty. Toe Walk - Bilateral - able to walk on toes without difficulty. Heel-Toe Walk - Bilateral - able to heel-toe walk without difficulty.  ROM - Flexion - mildly decreased range of motion and painful. Extension - mildly decreased range of motion and painful.  Pain: - neither flexion or extension is more painful than the other. Waddell's Signs - no Waddell's signs present.  Lower Extremity Range of Motion: - No true hip, knee or ankle pain with  range of motion.  Gait and Station: Assistive Devices - no assistive devices.   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 Risks/benefits/alternatives to the procedure/expectations following the procedure have been reviewed with her by Dr. Shon Arellano. She understands.  The MRI shows moderate to severe stenosis at L3-4 as well as at L4-5 with a grade 1 borderline 2 slip. L5-S1 has a slight anterolisthesis with severe facet arthrosis, right side is worse , but this does not cause neural displacement.  She has not yet completed her pre-op hospital requirements and is scheduled to do so in the near future. She has be fitted for a lumbar corsett brace with our therapy department and she knows to bring this with her the morning of surgery. She has been medically cleared for surgery by Dr. Collins [place] (please see the scanned documentation in the office chart for the specifics).    All of her questions have been encouraged, addressed and answered. Plan, at this time is to proceed with surgery as planned.  I have documented the physical exam findings based on examination of the patient by Dr. Shon Arellano.  (01/05/12) SECOND ADDENDUM Given the fact that this patients H&P from the office was outdated, and it was not noticed until the patient was on  the operating room table, I was instructed to 'just pull forward' the previous H&P so that the date would be correct.  I did as instructed by both Dr. Shon Arellano and the OR nursing staff however, I added an addendum (above in bold) to my original H&P (following completion of the procedure on the day of surgery) that stated that I  meerly documented the 'new' H&P in the patients chart as per Dr. Shon Arellano and that as I personally did not re-examine the patient at the time the new H&P was entered the physical exam findings were not based on my examination or assessment.   The H&P contained here is the one that was 'pulled forward' at the instructions of both Dr. Shon Arellano and the OR nursing staff.   Diana Arellano pac     Diana Gable, PA-C for Dr. Venita Lick [facility]  Diana Arellano  10/16/2011 2:21 PM  [facility]  Patient #: [id number]  DOB: Jun 05, 1945  Married / Language: [place] / Race: White  Female  History of Present Illness Diana Arellano; 10/16/2011 2:23 PM)  The patient is a 66 year old female who presents today for follow up of their back. The patient is being followed for their central back pain. Symptoms reported today include: pain (about the lower lumbar radiating into bilat. lower ext. to the level of the knee/posteriorly.), while the patient does not report symptoms of: weakness or numbness. The patient states that they are doing poorly. Current treatment includes: relative rest, activity modification and pain medications. The following medication has been used for pain control: Hydrocodone. The patient presents today following MRI.  Subjective Transcription Diana Beal, MD; 10/20/2011 12:32 PM)  She and her husband return for a followup of her MRI . She still has significant low back pain and buttock pain as well as bilateral leg pain. The right is always worse than the left.  Allergies Diana Arellano; 10/16/2011 2:23 PM)  SulfADIAZINE *Sulfonamides**  Neosporin *OPHTHALMIC AGENTS*  NEOSPORIN. 09/13/2006 (Marked as Inactive)  SULFA. 06/21/2006 (Marked as Inactive)  Social History Diana Arellano; 10/16/2011 2:24 PM)  Tobacco use. never smoker  Medication History Diana Arellano; 10/16/2011 2:24 PM)  Hydrocodone-Acetaminophen (10-325MG  Tablet, Oral) Active.  Objective Transcription  Diana Beal, MD; 10/20/2011 12:32 PM)  RADIOGRAPHS:  The MRI shows moderate to severe stenosis at L3-4 as well as at L4-5 with a grade 1 borderline 2 slip. L5-S1 has a slight anterolisthesis with severe facet arthrosis, right side is worse , but this does not cause neural displacement.  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 However, with the new MRI I do think the stenosis at L3-4 will also need to be addressed. Therefore, I would recommend more of a central decompression at L3-4, L4-5 and a partial decompression at L5-S1 to address that right facet arthrosis and instrumented fusion at L4-5 to keep the slips from intensifying. This can be accomplished with pedicle screw fixation and possibly interbody fixation if necessary.  I have explained this to the patient and her husband and they are area of the procedure and the indication. They would like to proceed. We will get preoperative medical clearance from her primary care physician. We will proceed at some point next  month.  I did review the risk with him to include infection, bleeding, nerve damage, death , stroke, paralysis, blood clots, ongoing or worse pain, loss of bowel and/or bladder control and need for further surgery. The goal of surgery is reduction in her pain and not elimination, to reduce her to a more palatable level of discomfort so her overall quality of life improves. All questions were encouraged and answered. She is expressing understanding of the procedure and the risks.  Miscellaneous Transcription Diana Beal, MD; 10/20/2011 12:32 PM)  Diana Garret D. Shon Baton, MD/jgc  T: 10-18-11  D: 10-16-11  Signed electronically by Diana Beal, MD (10/17/2011 4:40 PM)  Cosigned by: Venita Lick, MD [11/15/2011 8:20 AM]

## 2011-12-28 NOTE — Anesthesia Procedure Notes (Signed)
Procedure Name: Intubation Date/Time: 12/28/2011 7:33 AM Performed by: Jefm Miles E Pre-anesthesia Checklist: Patient identified, Timeout performed, Emergency Drugs available, Suction available and Patient being monitored Patient Re-evaluated:Patient Re-evaluated prior to inductionOxygen Delivery Method: Circle system utilized Preoxygenation: Pre-oxygenation with 100% oxygen Intubation Type: IV induction and Rapid sequence Laryngoscope Size: Mac and 3 Grade View: Grade I Tube type: Oral Tube size: 7.0 mm Number of attempts: 1 Airway Equipment and Method: Stylet Placement Confirmation: ETT inserted through vocal cords under direct vision,  breath sounds checked- equal and bilateral and positive ETCO2 Secured at: 20 cm Tube secured with: Tape Dental Injury: Teeth and Oropharynx as per pre-operative assessment

## 2011-12-28 NOTE — Brief Op Note (Signed)
12/28/2011  12:55 PM  PATIENT:  Monika Salk  66 y.o. female  PRE-OPERATIVE DIAGNOSIS:  SPINAL STENOSIS WITH SLIP  POST-OPERATIVE DIAGNOSIS:  SPINAL STENOSIS WITH SLIP  PROCEDURE:  Procedure(s) (LRB) with comments: POSTERIOR LUMBAR FUSION 2 LEVEL (N/A) - L3-5 DECOMPRESSION, FUSION L4-5, POSSIBLE INTERBODY FUSION L4-5  SURGEON:  Surgeon(s) and Role:    * Venita Lick, MD - Primary  PHYSICIAN ASSISTANT:   ASSISTANTS: Norval Gable   ANESTHESIA:   general  EBL:  Total I/O In: 3300 [I.V.:3200; Blood:100] Out: 800 [Urine:300; Blood:500]  BLOOD ADMINISTERED:none  DRAINS: 1 JP drain in the back   LOCAL MEDICATIONS USED:  MARCAINE     SPECIMEN:  No Specimen  DISPOSITION OF SPECIMEN:  N/A  COUNTS:  YES  TOURNIQUET:  * No tourniquets in log *  DICTATION: .Other Dictation: Dictation Number [id number]  PLAN OF CARE: Admit to inpatient   PATIENT DISPOSITION:  PACU - hemodynamically stable.

## 2011-12-28 NOTE — Anesthesia Preprocedure Evaluation (Addendum)
Anesthesia Evaluation  Patient identified by MRN, date of birth, ID band Patient awake    Reviewed: Allergy & Precautions, H&P , NPO status , Patient's Chart, lab work & pertinent test results  History of Anesthesia Complications Negative for: history of anesthetic complications  Airway Mallampati: III TM Distance: >3 FB Neck ROM: Full    Dental  (+) Teeth Intact and Dental Advisory Given   Pulmonary neg pulmonary ROS,  breath sounds clear to auscultation  Pulmonary exam normal       Cardiovascular hypertension, Pt. on medications Rhythm:Regular Rate:Normal     Neuro/Psych  Headaches, PSYCHIATRIC DISORDERS Anxiety Depression    GI/Hepatic Neg liver ROS, GERD-  Medicated and Controlled,  Endo/Other  Morbid obesity  Renal/GU negative Renal ROS     Musculoskeletal   Abdominal   Peds  Hematology   Anesthesia Other Findings   Reproductive/Obstetrics                           Anesthesia Physical Anesthesia Plan  ASA: II  Anesthesia Plan: General   Post-op Pain Management:    Induction: Intravenous and Rapid sequence  Airway Management Planned: Oral ETT  Additional Equipment:   Intra-op Plan:   Post-operative Plan: Extubation in OR  Informed Consent: I have reviewed the patients History and Physical, chart, labs and discussed the procedure including the risks, benefits and alternatives for the proposed anesthesia with the patient or authorized representative who has indicated his/her understanding and acceptance.   Dental advisory given  Plan Discussed with: CRNA, Anesthesiologist and Surgeon  Anesthesia Plan Comments:        Anesthesia Quick Evaluation

## 2011-12-29 ENCOUNTER — Inpatient Hospital Stay (HOSPITAL_COMMUNITY): Payer: Medicare Other

## 2011-12-29 IMAGING — CR DG LUMBAR SPINE 2-3V
2 series · 2 of 2 positions shown · non-contrast
Comparison: Radiographs dated [DATE] and [DATE]

CLINICAL DATA: Degenerative disc disease.  Status post lumbar
fusion at L4-5.

LUMBAR SPINE - 2-3 VIEW

[t l-spine a.p.]
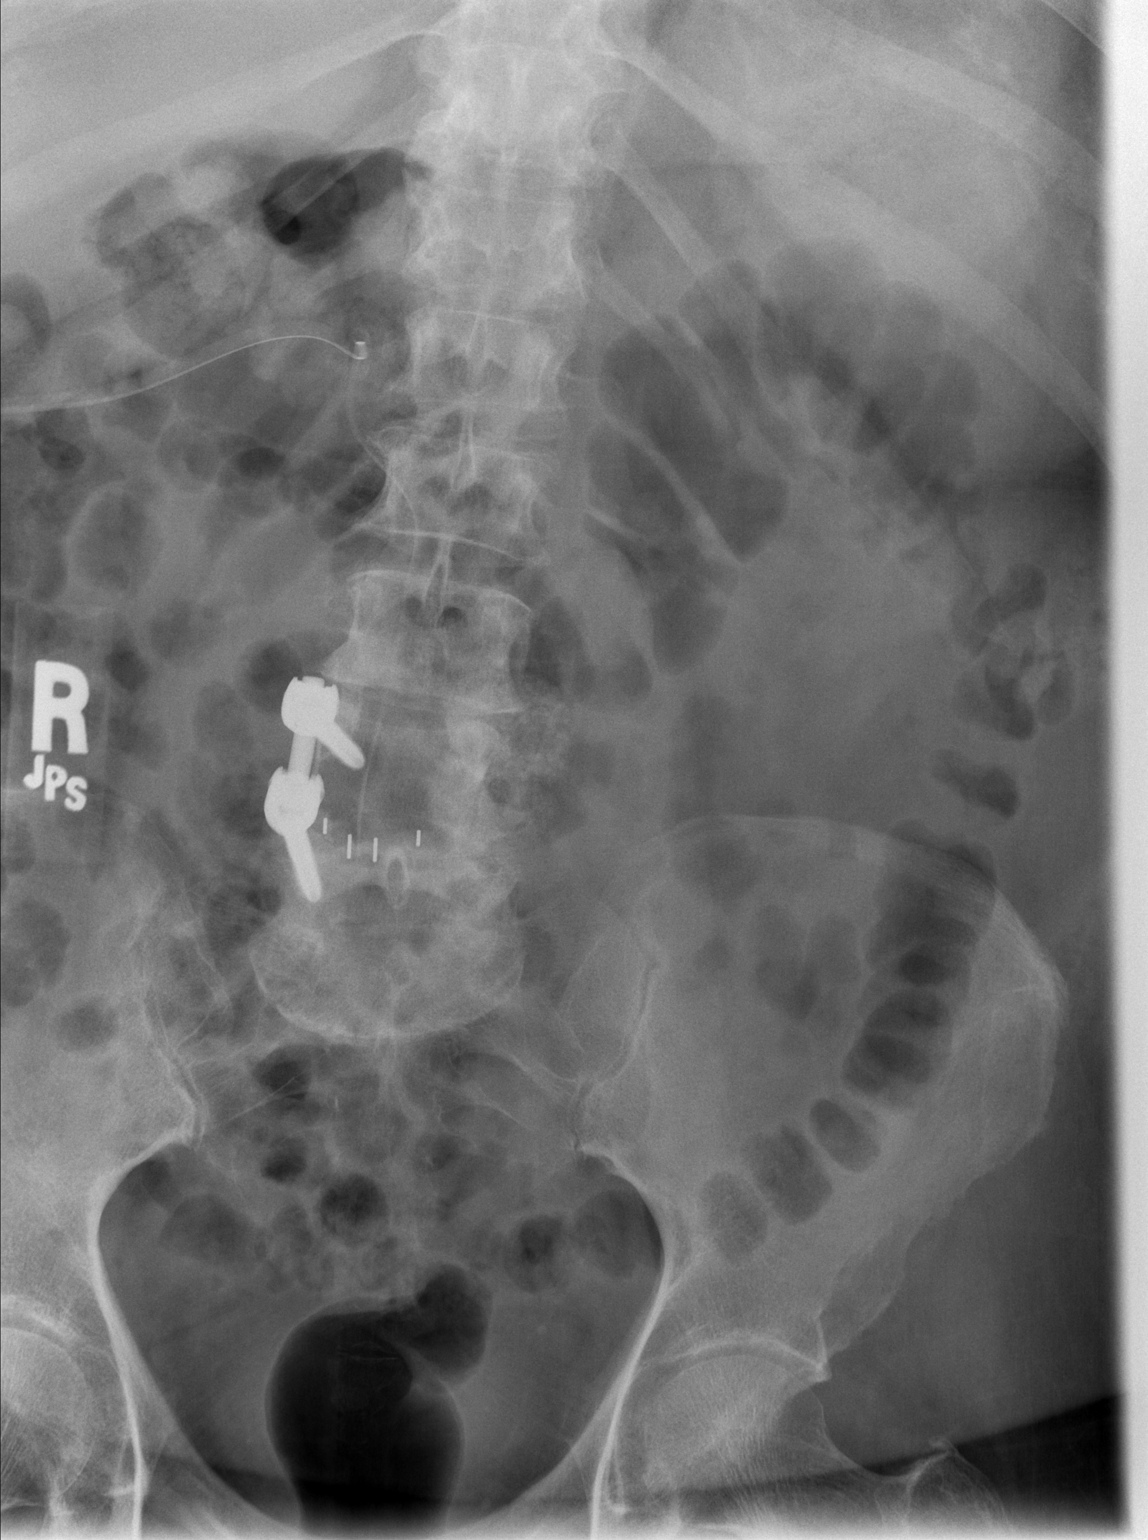

[t l-spine lat]
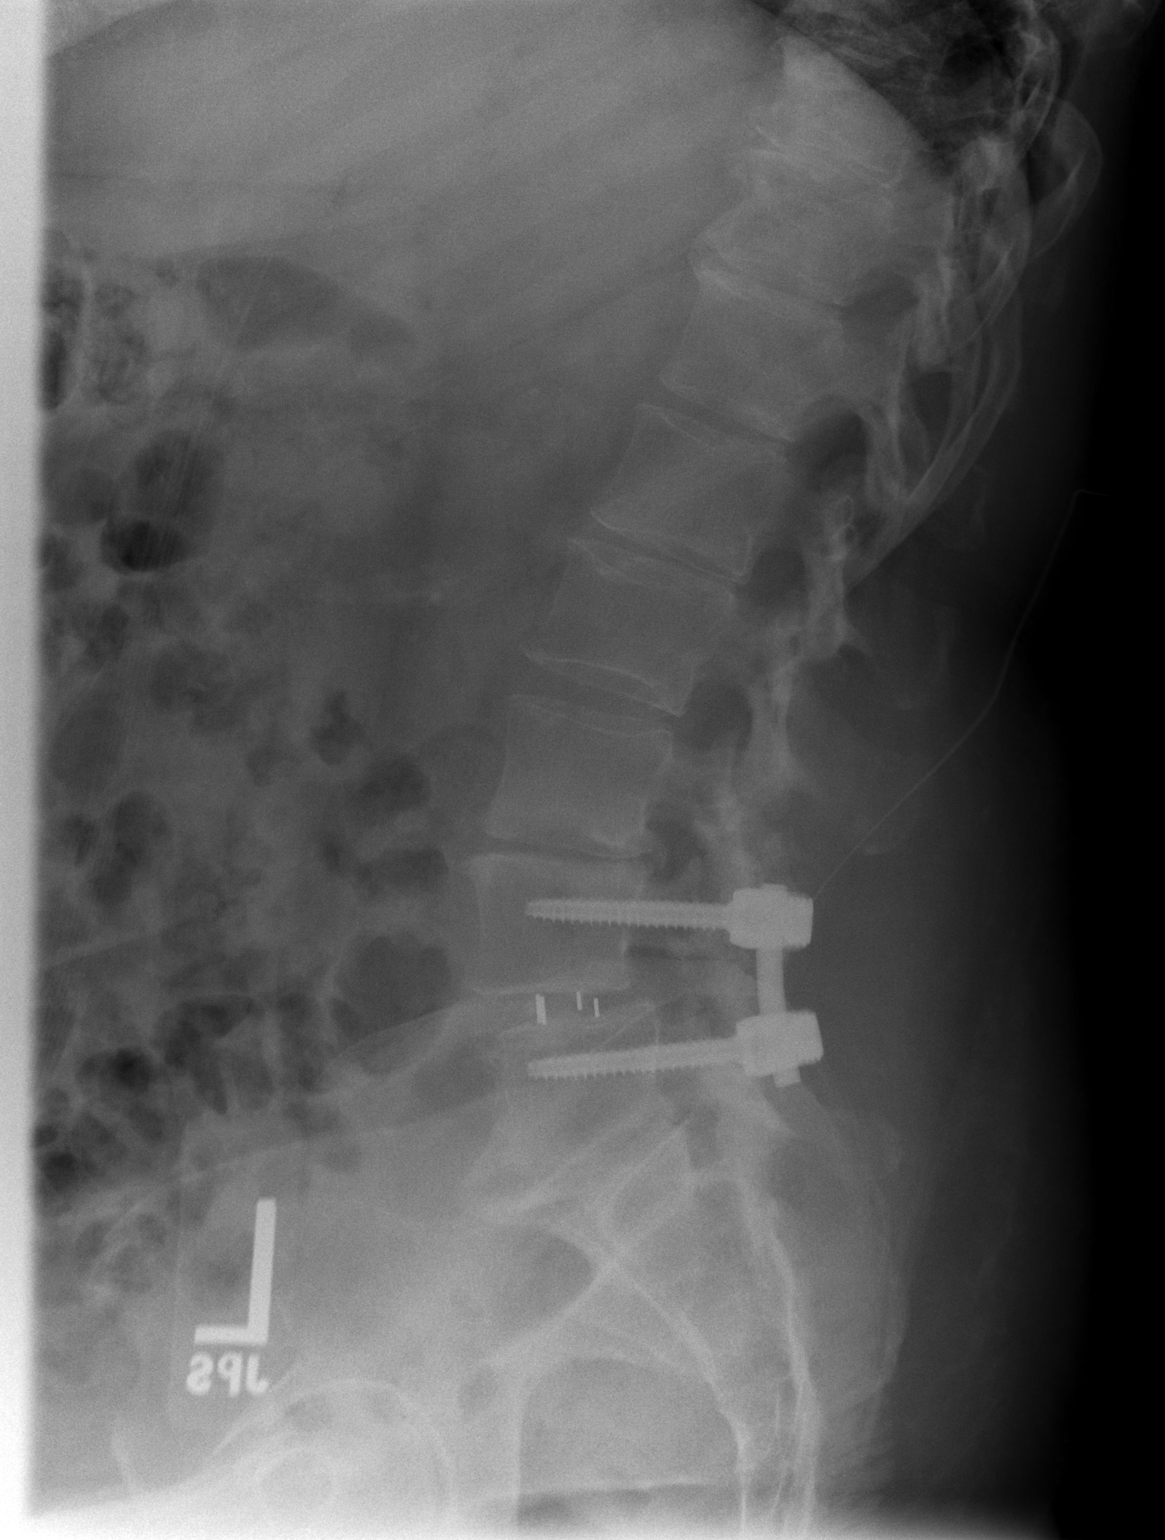

[2 of 2 positions shown; findings below may reference images not displayed]

FINDINGS: The patient has undergone posterior decompression at L4
with interbody fusion device at L4-5 and right pedicle screws and
posterior rod at L4-5.  The hardware appears in good position.
Slight spondylolisthesis of L4-L5, unchanged.  Moderate
degenerative disc disease at L3-4, unchanged.  The remainder of the
lumbar spine is normal.

Soft tissue drain seen at the operative site.
IMPRESSION: Satisfactory appearance of the lumbar spine after fusion at L4-5.

## 2011-12-29 MED ORDER — ONDANSETRON HCL 4 MG PO TABS: 4.0000 mg | ORAL_TABLET | Freq: Three times a day (TID) | ORAL | Status: AC | PRN

## 2011-12-29 MED ORDER — POLYETHYLENE GLYCOL 3350 17 G PO PACK: 17.0000 g | PACK | Freq: Every day | ORAL | Status: AC

## 2011-12-29 MED ORDER — MAGNESIUM CITRATE PO SOLN
0.5000 | Freq: Once | ORAL | Status: AC
  Administered 2011-12-29: 0.5 via ORAL
  Filled 2011-12-29: qty 296

## 2011-12-29 MED ORDER — OXYCODONE-ACETAMINOPHEN 10-325 MG PO TABS: 1.0000 | ORAL_TABLET | Freq: Four times a day (QID) | ORAL | Status: AC | PRN

## 2011-12-29 MED ORDER — METHOCARBAMOL 500 MG PO TABS: 500.0000 mg | ORAL_TABLET | Freq: Three times a day (TID) | ORAL | Status: AC

## 2011-12-29 MED ORDER — FLEET ENEMA 7-19 GM/118ML RE ENEM
1.0000 | ENEMA | Freq: Once | RECTAL | Status: DC
  Filled 2011-12-29 (×2): qty 1

## 2011-12-29 MED FILL — Sodium Chloride Irrigation Soln 0.9%: Qty: 3000 | Status: AC

## 2011-12-29 MED FILL — Mupirocin Oint 2%: CUTANEOUS | Qty: 22 | Status: AC

## 2011-12-29 MED FILL — Sodium Chloride IV Soln 0.9%: INTRAVENOUS | Qty: 1000 | Status: AC

## 2011-12-29 MED FILL — Hydromorphone HCl Inj 1 MG/ML: INTRAMUSCULAR | Qty: 1 | Status: AC

## 2011-12-29 MED FILL — Heparin Sodium (Porcine) Inj 1000 Unit/ML: INTRAMUSCULAR | Qty: 30 | Status: AC

## 2011-12-29 NOTE — Progress Notes (Signed)
Occupational Therapy Evaluation Patient Details Name: Diana Arellano MRN: [id number] DOB: 1945-09-05 Today's Date: 12/29/2011 Time: 5621-3086 OT Time Calculation (min): 38 min  OT Assessment / Plan / Recommendation Clinical Impression  66 yo s/p lumbar fusion. Lumbar corsett. Completed all education regarding ADL, available AE and DME for mobility. Pt has all DME and demonstrates/verbalizes independence with ADL. Husband will be home 24 /7 to assist as needed. Pt D/C form OT. OK for D/C tomorrow if able to do 3 STE with PT and cleared by MD.    OT Assessment  Patient does not need any further OT services    Follow Up Recommendations  No OT follow up    Barriers to Discharge  none    Equipment Recommendations  None recommended by OT    Recommendations for Other Services  none  Frequency    eval only   Precautions / Restrictions Precautions Precautions: Back Precaution Booklet Issued: Yes (comment) Precaution Comments: No bending, arching or twisting   Pertinent Vitals/Pain 3    ADL  Grooming: Simulated;Supervision/safety Where Assessed - Grooming: Unsupported standing Upper Body Bathing: Simulated;Set up Where Assessed - Upper Body Bathing: Unsupported sitting Lower Body Bathing: Simulated;Minimal assistance Where Assessed - Lower Body Bathing: Unsupported sit to stand Upper Body Dressing: Simulated;Set up Where Assessed - Upper Body Dressing: Unsupported sitting Lower Body Dressing: Simulated;Moderate assistance Where Assessed - Lower Body Dressing: Unsupported sit to stand Toilet Transfer: Simulated;Supervision/safety Toilet Transfer Method: Sit to Barista: Bedside commode Toileting - Clothing Manipulation and Hygiene: Simulated;Moderate assistance Where Assessed - Toileting Clothing Manipulation and Hygiene: Standing Tub/Shower Transfer: Simulated;Supervision/safety Equipment Used: Back brace;Gait belt Transfers/Ambulation Related to ADLs:  S ADL Comments: Educated on availability of AE for ADL and use for back precautions    OT Diagnosis:    OT Problem List:   OT Treatment Interventions:     OT Goals Acute Rehab OT Goals OT Goal Formulation:  (eval only)  Visit Information  Last OT Received On: 12/29/11 Assistance Needed: +1    Subjective Data      Prior Functioning  Vision/Perception  Home Living Lives With: Spouse Available Help at Discharge: Family;Available 24 hours/day Type of Home: House Home Access: Stairs to enter [employer] of Steps: 3 Entrance Stairs-Rails: Right Home Layout: One level Bathroom Shower/Tub: Walk-in shower;Door [employer] Toilet: Pharmacist, community: Yes Home Psychiatrist with back Prior Function Level of Independence: Independent Able to Take Stairs?: Yes Driving: Yes Vocation: Retired Musician: No difficulties Dominant Hand: Right      Cognition  Overall Cognitive Status: Appears within functional limits for tasks assessed/performed Arousal/Alertness: Awake/alert Orientation Level: Oriented X4 / Intact Behavior During Session: WFL for tasks performed    Extremity/Trunk Assessment Right Upper Extremity Assessment RUE ROM/Strength/Tone: Within functional levels Left Upper Extremity Assessment LUE ROM/Strength/Tone: Within functional levels Right Lower Extremity Assessment RLE ROM/Strength/Tone: Within functional levels Left Lower Extremity Assessment LLE ROM/Strength/Tone: Within functional levels   Mobility  Shoulder Instructions  Bed Mobility Bed Mobility: Supine to Sit;Rolling Left;Right Sidelying to Sit;Sit to Supine Rolling Right: 5: Supervision Right Sidelying to Sit: 4: Min assist;HOB flat;With rails Supine to Sit: 4: Min assist;HOB flat;With rails Sitting - Scoot to Edge of Bed: 5: Supervision Sit to Supine: 4: Min assist;HOB flat;With rail Details for Bed Mobility Assistance: mod vc for log  rollig and maintaining back precautions Transfers Transfers: Sit to Stand;Stand to Sit Sit to Stand: 5: Supervision Stand to Sit: 5: Supervision  Exercise     Balance Balance Balance Assessed:  (WFL)   End of Session OT - End of Session Equipment Utilized During Treatment: Gait belt;Back brace Activity Tolerance: Patient tolerated treatment well Patient left: in bed  GO     Maximum Reiland,HILLARY 12/29/2011, 1:45 PM [facility], OTR/L  [phone] 12/29/2011

## 2011-12-29 NOTE — Op Note (Signed)
[name], Arellano NO.:  [id number]  MEDICAL RECORD NO.:  [id number]  LOCATION:  [id number]                        FACILITY:  [facility]  PHYSICIAN:  Alvy Beal, MD    DATE OF BIRTH:  [birth year]  DATE OF PROCEDURE:  12/28/2011 DATE OF DISCHARGE:                              OPERATIVE REPORT   PREOPERATIVE DIAGNOSES: 1. Lumbar spinal stenosis, L3-4, L4-5. 2. Lumbar degenerative spondylolisthesis, L5.  POSTOPERATIVE DIAGNOSES: 1. Lumbar spinal stenosis, L3-4, L4-5. 2. Lumbar degenerative spondylolisthesis, L5.  OPERATIVE PROCEDURE: 1. L3-4 central compression. 2. L4-5 Gill decompression. 3. Posterolateral arthrodesis, L4-5. 4. Posterior segmental instrumentation, L4-5 with pedicle screw     fixation. 5. Complete diskectomy, L4-5 with insertion of intervertebral     biomechanical device for fusion.  COMPLICATIONS:  None.  FIRST ASSISTANT:  Norval Gable, [place].  HISTORY:  This is a very pleasant elderly woman who has been under my care for sometime.  Despite appropriate conservative management, she continued to have severe disabling back, buttock, bilateral leg pain with the right side being more significant than the left.  After discussing treatment options, she elected to proceed with surgery.  All appropriate risks, benefits, and alternatives were discussed with the patient and consent was obtained.  INSTRUMENTATION SYSTEM USED:  DePuy carbon fiber size 8 lordotic intervertebral cage, packed with local bone and DBX, and the Synthes pedicle screws fixation, L4-5 unilateral.  Attempted bilateral pedicle screw fixation.  However, there was a lateral compromise to the L4 pedicle, which prevented insertion of the pedicle on the left side at L4, and so I opted only to use a unilateral pedicle screw construct.  OPERATIVE NOTE:  The patient was brought to the operating room, placed supine on the operating table.  After successful induction of  general anesthesia and endotracheal intubation, TEDs, SCDs, and Foley were inserted.  Neuromonitoring representative then placed all appropriate needles for intraoperative neuromonitoring.  Once this was done, the patient was turned prone onto the Wilson frame.  All bony prominences were well padded.  Back was prepped and draped in standard fashion. Once this was completed, time-out was done to confirm patient, procedure, and all other pertinent important data.  At this point, once the time-out completed, I then made an incision starting just superior to the L3 spinous process and proceeding down to the superior portion of the L5.  Sharp dissection was carried out down to the deep fascia in the midline.  The midline fascia was released and using a Cobb elevator, I stripped the paraspinal muscles to expose the spinous process of L3 and L4 and portion of that L5.  At this point, I then exposed the L4-5 facet and L3-4 facet bilaterally.  I then exposed the L3 and L4 transverse processes.  Once this was done bilaterally, I then proceeded with my Gill decompression.  I confirmed the L4-5 disk space on lateral fluoroscopy and then using an osteotome, I resected the bulk of the L4 right-sided inferior facet.  With this resected I could then and I easily with a 3 mm Kerrison to complete my laminotomy of L4. Once this was done, I then released the ligamentum flavum and then  completed my laminectomy of L4 on that right side.  I removed the ligamentum flavum and trimmed the superior portion of the L5 lamina to allow access.  I then continued my decompression using a 2 and 3-mm Kerrison into the lateral gutter.  I exposed the L5 nerve root and traced it into the L5 foramen.  I then identified the medial border of the L5 pedicle and continued to remove the osteophyte from the superior L5 facet.  Once this was done, I then continued my dissection superiorly with my Kerrison rongeurs, removing the pars  interarticularis.  I then identified the L4 nerve root and the inferior portion of the L4 pedicle. At this point, I had a complete Gill decompression with resection of the facet complex and complete exposure of the L4 nerve root in the foramen and L5 as it was entering into the foramen.  At this point, I protected the both nerve roots and thecal sac with Neuro Patties and retractor and then visualized the anterior disk space.  I then used bipolar electrocautery to mobilize and coagulate the epidural veins and then incised the disk space with a 15-blade scalpel.  Then using a combination of pituitary rongeurs, curettes, and Kerrison rongeurs, I removed all of the disk material at the L4-5 level.  I then used a side- cutting curette to ensure that I had bony contact and I had removed the cartilaginous endplate.  Once this was done, I packed the bone graft that I had harvested from the decompression along the anterior annulus and then malleted a size 8 cage into the proper position.  I confirmed satisfactory position in the AP and lateral planes.  At this point, under direct visualization using my anatomical landmarks, I placed an awl into the L5 and the L4 pedicle.  I then advanced the Jamshidi needle into the pedicle and into the vertebral body.  I then palpated and visualized the pedicles themselves and confirmed with neurodiagnostic testing that there was no evidence of breach.  I then tapped the pedicle and then placed the appropriate size 40-mm 6.0 diameter pedicle screws at L4 and L5.  I then stimulated the screws and confirmed it again that I had adequately positioned pedicle screws.  At this point, I then repositioned my retractor and then removed the remainder of the L4 spinous process.  I then removed the majority of the L3 spinous process as well, and using the plane that I had created from the TLIF, I continued my dissection using Kerrison punches over to the left-hand side.  I  completed the L4 laminectomy thereby decompressing the L3-4 level and the L4-5 level.  I removed any remaining bone spurs in the lateral gutter and then continued my dissection up to L3.  At this point, this allowed me to do the central and lateral decompression that was required at the C3-4 level.  At this point, I then attempted to cannulate the L4 pedicle.  I was having great difficulty and then ultimately, I had the lateral pedicle breach.  Because of this and my concern about causing iatrogenic injury, I elected to only do a unilateral construct.  With the interbody cage, I felt as though this was biomechanically adequate.  I then decorticated the transverse processes of L4 and L5 on that left hand side as well as the complete facet resection at the L4-5 facet complex.  I resected the capsule.  I then burred and then placed the significant amount of bone graft at the  L4-5 level.  I then irrigated it.  I used bipolar electrocautery to obtain hemostasis and then placed a deep drain.  I then closed the fascia with interrupted #1 Vicryl sutures.  At this point, with the decompression and the instrumentation complete, I then contoured a rod and secured it to the pedicle screws on the right side, and locked and torqued down the top hats appropriately.  I irrigated the wound copiously with normal saline, used bipolar electrocautery to obtain hemostasis and then closed over drain the deep fascia with #1 Vicryl sutures, then superficial with 2-0 Vicryl sutures and 3-0 Monocryl for the skin. Steri-Strips and dry dressing were applied.  The patient was extubated and transferred to the PACU without incident.  At the end of the case, all needle and sponge counts were correct.  No adverse intraoperative neuromonitoring events.     Alvy Beal, MD     DDB/MEDQ  D:  12/28/2011  T:  12/29/2011  Job:  [id number]

## 2011-12-29 NOTE — Progress Notes (Addendum)
    Subjective: Procedure(s) (LRB): POSTERIOR LUMBAR FUSION 2 LEVEL (N/A) 1 Day Post-Op  Patient reports pain as 4 on 0-10 scale.  Reports decreased leg pain reports incisional back pain   Positive void Negative bowel movement Positive flatus Negative chest pain or shortness of breath  Objective: Vital signs in last 24 hours: Temp:  [97.6 F (36.4 C)-98.2 F (36.8 C)] 97.6 F (36.4 C) (09/06 0655) Pulse Rate:  [70-93] 70  (09/06 0655) Resp:  [9-27] 18  (09/06 0655) BP: (99-142)/(57-71) 142/58 mmHg (09/06 0655) SpO2:  [93 %-100 %] 99 % (09/06 0655) Weight:  [102.059 kg (225 lb)] 102.059 kg (225 lb) (09/05 1515)  Intake/Output from previous day: 09/05 0701 - 09/06 0700 In: 3350 [I.V.:3200; Blood:100] Out: 3360 [Urine:2800; Drains:60; Blood:500]  Labs: No results found for this basename: WBC:2,RBC:2,HCT:2,PLT:2 in the last 72 hours No results found for this basename: NA:2,K:2,CL:2,CO2:2,BUN:2,CREATININE:2,GLUCOSE:2,CALCIUM:2 in the last 72 hours No results found for this basename: LABPT:2,INR:2 in the last 72 hours Drain: 75 over 24hrs  Physical Exam: Neurologically intact ABD soft Neurovascular intact Intact pulses distally Incision: dressing C/D/I and no drainage Compartment soft  Assessment/Plan: Patient stable  xrays satisfactory Continue mobilization with physical therapy Continue care  Continue Abx since drain will remain Possible d/c Sunday  Venita Lick, MD [facility] [phone]

## 2011-12-29 NOTE — Progress Notes (Signed)
CARE MANAGEMENT NOTE 12/29/2011  Patient:  Diana Arellano, Diana Arellano   Account Number:  [id number]  Date Initiated:  12/29/2011  Documentation initiated by:  Vance Peper  Subjective/Objective Assessment:   66 yr old female s/p L3-4 central compression, L4-5 Gill decompression, complete diskectomy     Action/Plan:   Patient has no need for home health. Has DME   Anticipated DC Date:  12/30/2011   Anticipated DC Plan:  HOME/SELF CARE      DC Planning Services  CM consult      Choice offered to / List presented to:             Status of service:  Completed, signed off Medicare Important Message given?   (If response is "NO", the following Medicare IM given date fields will be blank) Date Medicare IM given:   Date Additional Medicare IM given:    Discharge Disposition:  HOME/SELF CARE  Per UR Regulation:    If discussed at Long Length of Stay Meetings, dates discussed:    Comments:

## 2011-12-29 NOTE — Progress Notes (Signed)
Utilization review completed.  

## 2011-12-29 NOTE — Progress Notes (Signed)
Referral received for SNF. Chart reviewed and CSW has spoken with RNCM who indicates that patient is for DC to home with no home health needs. Patient has her  DME in place per [facility].  No SNF placement is indicated.  CSW to sign off. Please re-consult if CSW needs arise.  Lorri Frederick. [place]  [phone]

## 2011-12-29 NOTE — Evaluation (Signed)
Physical Therapy Evaluation Patient Details Name: Diana Arellano MRN: [id number] DOB: [birth year] Today's Date: 12/29/2011 Time: 4098-1191 PT Time Calculation (min): 41 min  PT Assessment / Plan / Recommendation Clinical Impression  Pt is a 66 y/o female s/p lumbar fusion.  Acute PT to follow pt to maximize mobility and Back precaution education    PT Assessment  Patient needs continued PT services    Follow Up Recommendations  No PT follow up    Barriers to Discharge None      Equipment Recommendations  None recommended by PT    Recommendations for Other Services     Frequency Min 5X/week    Precautions / Restrictions Precautions Precautions: Back Precaution Booklet Issued: Yes (comment) Precaution Comments: No bending, arching or twisting   Pertinent Vitals/Pain 6/10 Right mid back pain at site of incision. Pt medicated prior to session.       Mobility  Bed Mobility Bed Mobility: Rolling Right;Right Sidelying to Sit;Sitting - Scoot to [place] of Bed Rolling Right: 4: Min assist (4 trials ) Right Sidelying to Sit: 4: Min assist;HOB flat Sitting - Scoot to Edge of Bed: 7: Independent Details for Bed Mobility Assistance: Educated pt in proper log roll technique.  Assist to prevent feet from sliding, Tactile cues to rotate hips and shoulder in uninson.  Verbal cues to turn head to left with trunk/hip rotation.   Transfers Transfers: Sit to Stand;Stand to Sit Sit to Stand: 5: Supervision Stand to Sit: 5: Supervision Details for Transfer Assistance: Educated pt to scoot to edge of chair/bed prior to sitting and sit on edge of chair/bed then scoot back when sitting. Cues for hand placement.  Pt able to return demonstrate.  Ambulation/Gait Ambulation/Gait Assistance: 5: Supervision Ambulation Distance (Feet): 150 Feet Assistive device: Rolling walker;Other (Comment) (IV Pole ) Ambulation/Gait Assistance Details: 75 feet with RW and 75 feet with IV pole only.   Gait Pattern:  Within Functional Limits Stairs: Yes Stairs Assistance: 6: Modified independent (Device/Increase time) Stair Management Technique: One rail Right Number of Stairs: 3     Exercises     PT Diagnosis: Acute pain  PT Problem List: Decreased knowledge of precautions;Decreased mobility PT Treatment Interventions: Gait training;Therapeutic activities;Patient/family education   PT Goals Acute Rehab PT Goals PT Goal Formulation: With patient Time For Goal Achievement: 01/05/12 Potential to Achieve Goals: Good Pt will Roll Supine to Right Side: with modified independence PT Goal: Rolling Supine to Right Side - Progress: Goal set today Pt will Roll Supine to Left Side: with modified independence PT Goal: Rolling Supine to Left Side - Progress: Goal set today Pt will go Supine/Side to Sit: with modified independence;with HOB 0 degrees PT Goal: Supine/Side to Sit - Progress: Goal set today Pt will go Sit to Supine/Side: with modified independence;with HOB 0 degrees PT Goal: Sit to Supine/Side - Progress: Goal set today Pt will Ambulate: >150 feet;Independently PT Goal: Ambulate - Progress: Goal set today  Visit Information  Last PT Received On: 12/29/11 Assistance Needed: +1    Subjective Data  Subjective: agree to PT eval   Prior Functioning  Home Living Lives With: Spouse Available Help at Discharge: Family;Available 24 hours/day Type of Home: House Home Access: Stairs to enter [employer] of Steps: 3 Entrance Stairs-Rails: Right Home Layout: One level Bathroom Shower/Tub: Walk-in shower;Door [employer] Toilet: Standard Bathroom Accessibility: Yes Home Adaptive Equipment: None Prior Function Level of Independence: Independent Able to Take Stairs?: Yes Driving: Yes Vocation: Retired Musician: No difficulties Dominant Hand:  Right    Cognition  Overall Cognitive Status: Appears within functional limits for tasks  assessed/performed Arousal/Alertness: Awake/alert Orientation Level: Oriented X4 / Intact Behavior During Session: [facility] for tasks performed    Extremity/Trunk Assessment Right Upper Extremity Assessment RUE ROM/Strength/Tone: Within functional levels Left Upper Extremity Assessment LUE ROM/Strength/Tone: Within functional levels Right Lower Extremity Assessment RLE ROM/Strength/Tone: Within functional levels RLE Sensation: WFL - Light Touch;WFL - Proprioception Left Lower Extremity Assessment LLE ROM/Strength/Tone: Within functional levels LLE Sensation: WFL - Light Touch;WFL - Proprioception Trunk Assessment Trunk Assessment: Normal   Balance Balance Balance Assessed: Yes Static Standing Balance Static Standing - Balance Support: No upper extremity supported Static Standing - Level of Assistance: 7: Independent  End of Session PT - End of Session Activity Tolerance: Patient tolerated treatment well  GP     Diana Arellano 12/29/2011, 10:23 AM  Theron Arista L. Rhyse Loux DPT [phone]

## 2011-12-30 MED ORDER — CEFAZOLIN SODIUM-DEXTROSE 2-3 GM-% IV SOLR
2.0000 g | Freq: Three times a day (TID) | INTRAVENOUS | Status: DC
  Administered 2011-12-30 – 2011-12-31 (×3): 2 g via INTRAVENOUS
  Filled 2011-12-30 (×5): qty 50

## 2011-12-30 MED ORDER — WHITE PETROLATUM GEL
Status: AC
  Administered 2011-12-30
  Filled 2011-12-30: qty 5

## 2011-12-30 NOTE — Progress Notes (Signed)
Physical Therapy Treatment Patient Details Name: Diana Arellano MRN: [id number] DOB: 08/10/1945 Today's Date: 12/30/2011 Time: 4098-1191 PT Time Calculation (min): 14 min  PT Assessment / Plan / Recommendation Comments on Treatment Session  Good progress with increased gait distance today and less cues/assistance needed. Pt already out of bed on arrival to room and to chair for lunch after ambulation therefore will plan to work on bed mobility more next session.    Follow Up Recommendations  No PT follow up       Equipment Recommendations  None recommended by PT;None recommended by OT       Frequency Min 5X/week   Plan Discharge plan remains appropriate;Frequency remains appropriate    Precautions / Restrictions Precautions Precautions: Back Precaution Comments: pt independent with recall of 3/3 precauions, min cues not to twist gait in hallway (pt looking around  with slight twist in spine). Required Braces or Orthoses: Spinal Brace Spinal Brace: Lumbar corset;Applied in sitting position (min assist with vc's to don at edge of bed)    Pertinent Vitals/Pain Rated pain 4/10 before and after session in low back.    Mobility  Transfers Sit to Stand: 6: Modified independent (Device/Increase time);From toilet;With upper extremity assist;From bed Stand to Sit: 6: Modified independent (Device/Increase time);To chair/3-in-1;With upper extremity assist;To bed Details for Transfer Assistance: pt up in bathroom upon arrival to room with NT (no brace on). Had pt sit on bed to don brace before ambulation in hallway. no cues or assistanc with transfers. Ambulation/Gait Ambulation/Gait Assistance: 6: Modified independent (Device/Increase time) Ambulation Distance (Feet): 200 Feet Assistive device: None Ambulation/Gait Assistance Details: steady gait with no loss of balance. occasional cues for upright posture and to not "twist" to look around. Gait Pattern: Step-through pattern;Decreased  stride length      PT Goals Acute Rehab PT Goals PT Goal: Ambulate - Progress: Met  Visit Information  Last PT Received On: 12/30/11 Assistance Needed: +1    Subjective Data  Subjective: No new complaints. Agreeable to therapy today.   Cognition  Overall Cognitive Status: Appears within functional limits for tasks assessed/performed Arousal/Alertness: Awake/alert Orientation Level: Appears intact for tasks assessed Behavior During Session: [facility] for tasks performed    Balance     End of Session PT - End of Session Equipment Utilized During Treatment: Gait belt;Back brace Activity Tolerance: Patient tolerated treatment well Patient left: in chair;with call bell/phone within reach Nurse Communication: Mobility status   GP     Sallyanne Kuster 12/30/2011, 11:46 AM  Sallyanne Kuster, PTA Office- [phone]

## 2011-12-30 NOTE — Progress Notes (Signed)
Orthopedics Progress Note  Subjective: Patient with some diarrhea this AM after the mag citrate, otherwise feeling well  Objective:  Filed Vitals:   12/30/11 0534  BP: 142/63  Pulse: 70  Temp: 97.9 F (36.6 C)  Resp: 18    General: Awake and alert  Musculoskeletal: lumbar incisions CDI, Dressing changed Drain left in due to output at 70cc last shift Neurovascularly intact  Lab Results  Component Value Date   WBC 6.3 12/14/2011   HGB 13.8 12/14/2011   HCT 40.5 12/14/2011   MCV 91.6 12/14/2011   PLT 157 12/14/2011       Component Value Date/Time   NA 142 12/14/2011 1420   K 4.3 12/14/2011 1420   CL 103 12/14/2011 1420   CO2 30 12/14/2011 1420   GLUCOSE 102* 12/14/2011 1420   BUN 14 12/14/2011 1420   CREATININE 0.84 12/14/2011 1420   CALCIUM 9.8 12/14/2011 1420   GFRNONAA 71* 12/14/2011 1420   GFRAA 83* 12/14/2011 1420    Lab Results  Component Value Date   INR 1.11 12/14/2011   INR 1.10 11/21/2011    Assessment/Plan: POD #2 s/p Procedure(s): POSTERIOR LUMBAR FUSION 2 LEVEL Patient doing very well. Moving well, very mobile Drain output still up, needs to be below 30 cc per shift before drain is pulled per Dr Shon Baton Restart IV Ancef per Dr Shon Baton Possible D/C tomorrow Drain sewn in  [place]. Ranell Patrick, MD 12/30/2011 8:37 AM

## 2011-12-31 NOTE — Progress Notes (Signed)
Physical Therapy Treatment Patient Details Name: Diana Arellano MRN: [id number] DOB: 1946/01/14 Today's Date: 12/31/2011 Time: 4098-1191 PT Time Calculation (min): 24 min  PT Assessment / Plan / Recommendation Comments on Treatment Session  Continuing good progress and good demonstration of bed mobility; OK for dc from PT standpoint    Follow Up Recommendations  No PT follow up    Barriers to Discharge        Equipment Recommendations  None recommended by PT;None recommended by OT    Recommendations for Other Services    Frequency Min 5X/week   Plan Discharge plan remains appropriate;Frequency remains appropriate    Precautions / Restrictions Precautions Precautions: Back Precaution Comments: pt independent with recall of 3/3 precauions, min cues not to twist gait in hallway (pt looking around  with slight twist in spine). Required Braces or Orthoses: Spinal Brace Spinal Brace: Lumbar corset;Applied in sitting position   Pertinent Vitals/Pain 2/10 back pain    Mobility  Bed Mobility Bed Mobility: Supine to Sit;Rolling Left;Right Sidelying to Sit;Sit to Supine Rolling Right: 5: Supervision Right Sidelying to Sit: 5: Supervision Sitting - Scoot to Edge of Bed: 5: Supervision Details for Bed Mobility Assistance: Cues for lof rolling Transfers Transfers: Sit to Stand;Stand to Sit Sit to Stand: 6: Modified independent (Device/Increase time);From toilet;With upper extremity assist;From bed Stand to Sit: 6: Modified independent (Device/Increase time);To chair/3-in-1;With upper extremity assist;To bed Details for Transfer Assistance: Smooth transitions Ambulation/Gait Ambulation/Gait Assistance: 6: Modified independent (Device/Increase time) Ambulation Distance (Feet): 200 Feet Assistive device: None Ambulation/Gait Assistance Details: Movign and ambulating well Gait Pattern: Step-through pattern;Decreased stride length Stairs:  (pt reports no questions re: stairs)      Exercises     PT Diagnosis:    PT Problem List:   PT Treatment Interventions:     PT Goals Acute Rehab PT Goals Time For Goal Achievement: 01/05/12 Potential to Achieve Goals: Good Pt will Roll Supine to Right Side: with modified independence PT Goal: Rolling Supine to Right Side - Progress: Progressing toward goal Pt will go Supine/Side to Sit: with modified independence;with HOB 0 degrees PT Goal: Supine/Side to Sit - Progress: Progressing toward goal Pt will go Sit to Supine/Side: with modified independence;with HOB 0 degrees PT Goal: Sit to Supine/Side - Progress: Progressing toward goal Pt will Ambulate: >150 feet;Independently PT Goal: Ambulate - Progress: Met  Visit Information  Last PT Received On: 12/31/11 Assistance Needed: +1    Subjective Data  Subjective: No new complaints. Agreeable to therapy today. Patient Stated Goal: Home today   Cognition  Overall Cognitive Status: Appears within functional limits for tasks assessed/performed Arousal/Alertness: Awake/alert Orientation Level: Appears intact for tasks assessed Behavior During Session: [facility] for tasks performed    Balance     End of Session PT - End of Session Equipment Utilized During Treatment: Back brace Activity Tolerance: Patient tolerated treatment well Patient left: in chair;with call bell/phone within reach Nurse Communication: Mobility status   GP     Van Clines Hamff 12/31/2011, 12:58 PM

## 2011-12-31 NOTE — Progress Notes (Signed)
Patient ID: Diana Arellano, female   DOB: 1946-03-24, 66 y.o.   MRN: [id number] Subjective: 3 Days Post-Op Procedure(s) (LRB): POSTERIOR LUMBAR FUSION 2 LEVEL (N/A)    Patient reports pain as mild.  Objective:   VITALS:   Filed Vitals:   12/31/11 0630  BP: 132/64  Pulse:   Temp:   Resp:     Neurovascular intact Incision: scant drainage in drain but drain removed  LABS No results found for this basename: HGB:3,HCT:3,WBC:3,PLT:3 in the last 72 hours  No results found for this basename: NA:3,K:3,BUN:3,CREATININE:3,GLUCOSE:3 in the last 72 hours  No results found for this basename: LABPT:2,INR:2 in the last 72 hours   Assessment/Plan: 3 Days Post-Op Procedure(s) (LRB): POSTERIOR LUMBAR FUSION 2 LEVEL (N/A)   Discharge home with home health Follow up with Dr Shon Baton in 1-2 weeks

## 2012-01-05 NOTE — Discharge Summary (Signed)
Patient ID: Diana Arellano MRN: [id number] DOB/AGE: [id number] y.o.  Admit date: 12/28/2011 Discharge date: 12/31/2011   Admission Diagnoses:  Active Problems:  Lumbar spinal stenosis  Spondylolisthesis of lumbar region   Discharge Diagnoses:  Active Problems:  Lumbar spinal stenosis  Spondylolisthesis of lumbar region  status post Procedure(s): POSTERIOR LUMBAR FUSION 2 LEVEL  Past Medical History  Diagnosis Date  . Complication of anesthesia     diff to start stream  . Anxiety   . Depression   . Arthritis   . GERD (gastroesophageal reflux disease)   . Hypertension     Diana Arellano  . Heart murmur   . Headache     hx of    Surgeries: Procedure(s): POSTERIOR LUMBAR FUSION 2 LEVEL on 12/28/2011   Consultants: none  Discharged Condition: Improved  Hospital Course: Diana Arellano is an 66 y.o. female who was admitted 12/28/2011 for operative treatment of lumbar spinal stenosis and spondylolisthesis. Patient failed conservative treatments (please see the history and physical for the specifics) and had severe unremitting pain that affects sleep, daily activities and work/hobbies. After pre-op clearance, the patient was taken to the operating room on 12/28/2011 and underwent  Procedure(s): POSTERIOR LUMBAR FUSION 2 LEVEL.    Patient was given perioperative antibiotics:  Anti-infectives     Start     Dose/Rate Route Frequency Ordered Stop   12/30/11 1000   ceFAZolin (ANCEF) IVPB 2 g/50 mL premix  Status:  Discontinued        2 g 100 mL/hr over 30 Minutes Intravenous 3 times per day 12/30/11 0837 12/31/11 0915   12/28/11 1530   ceFAZolin (ANCEF) IVPB 1 g/50 mL premix        1 g 100 mL/hr over 30 Minutes Intravenous Every 8 hours 12/28/11 1528 12/28/11 2334   12/27/11 1434   ceFAZolin (ANCEF) IVPB 2 g/50 mL premix  Status:  Discontinued        2 g 100 mL/hr over 30 Minutes Intravenous 60 min pre-op 12/27/11 1434 12/28/11 0617   11/28/11 1505   ceFAZolin (ANCEF) IVPB  2 g/50 mL premix        2 g 100 mL/hr over 30 Minutes Intravenous 60 min pre-op 11/28/11 1505 12/28/11 1136           Patient was given sequential compression devices and early ambulation to prevent DVT.   Patient benefited maximally from hospital stay and there were no complications. At the time of discharge, the patient was urinating/moving their bowels without difficulty, tolerating a regular diet, pain is controlled with oral pain medications and they have been cleared by PT/OT.   Recent vital signs: No data found.    Recent laboratory studies: No results found for this basename: WBC:2,HGB:2,HCT:2,PLT:2,NA:2,K:2,CL:2,CO2:2,BUN:2,CREATININE:2,GLUCOSE:2,PT:2,INR:2,CALCIUM,2: in the last 72 hours   Discharge Medications:     Medication List     As of 01/05/2012  7:59 AM    STOP taking these medications         HYDROcodone-acetaminophen 10-325 MG per tablet   Commonly known as: NORCO      TAKE these medications         aspirin EC 81 MG tablet   Take 162 mg by mouth every morning.      b complex vitamins tablet   Take 1 tablet by mouth every evening.      FLUoxetine 20 MG capsule   Commonly known as: PROZAC   Take 20 mg by mouth at bedtime.  gabapentin 800 MG tablet   Commonly known as: NEURONTIN   Take 800 mg by mouth 2 (two) times daily.      lisinopril 40 MG tablet   Commonly known as: PRINIVIL,ZESTRIL   Take 40 mg by mouth at bedtime.      methocarbamol 500 MG tablet   Commonly known as: ROBAXIN   Take 1 tablet (500 mg total) by mouth 3 (three) times daily. MAX 3 pills daily      multivitamin with minerals Tabs   Take 1 tablet by mouth at bedtime.      omeprazole 40 MG capsule   Commonly known as: PRILOSEC   Take 40 mg by mouth every evening.      ondansetron 4 MG tablet   Commonly known as: ZOFRAN   Take 1 tablet (4 mg total) by mouth every 8 (eight) hours as needed for nausea. MAX 3 pills daily      oxyCODONE-acetaminophen 10-325 MG per tablet    Commonly known as: PERCOCET   Take 1 tablet by mouth every 6 (six) hours as needed for pain. MAX 4 pills daily      simvastatin 40 MG tablet   Commonly known as: ZOCOR   Take 40 mg by mouth at bedtime.        Diagnostic Studies: Dg Lumbar Spine 2-3 Views  12/29/2011  *RADIOLOGY REPORT*  Clinical Data: Degenerative disc disease.  Status post lumbar fusion at L4-5.  LUMBAR SPINE - 2-3 VIEW  Comparison: Radiographs dated 07/30 and 12/28/2011  Findings: The patient has undergone posterior decompression at L4 with interbody fusion device at L4-5 and right pedicle screws and posterior rod at L4-5.  The hardware appears in good position. Slight spondylolisthesis of L4-L5, unchanged.  Moderate degenerative disc disease at L3-4, unchanged.  The remainder of the lumbar spine is normal.  Soft tissue drain seen at the operative site.  IMPRESSION: Satisfactory appearance of the lumbar spine after fusion at L4-5.   Original Report Authenticated By: Gwynn Burly, M.D.    Dg Lumbar Spine 2-3 Views  12/28/2011  *RADIOLOGY REPORT*  Clinical Data: Posterior lumbar spine fusion and decompression.  LUMBAR SPINE - 2-3 VIEW  Comparison:  Fluoroscopy time:  2.37 minutes  Findings: Right-sided pedicle screws and a single interconnecting rod fuse L4-L5, with a disc spacer well-centered between the L4-L5 vertebrae partly maintaining disc height.  The orthopedic hardware is well seated and aligned.  There is a 5 mm anterolisthesis of L4 on L5. Disc degenerative changes are also noted at L3-L4 and L5-S1.  No evidence of an operative complication.   Original Report Authenticated By: Domenic Moras, M.D.         Discharge Orders    Future Orders Please Complete By Expires   Diet - low sodium heart healthy      Face-to-face encounter (required for Medicare/Medicaid patients)      Comments:   I Gwinda Maine certify that this patient is under my care and that I, or a nurse practitioner or physician's assistant  working with me, had a face-to-face encounter that meets the physician face-to-face encounter requirements with this patient on 12/29/2011.   Questions: Responses:   The encounter with the patient was in whole, or in part, for the following medical condition, which is the primary reason for home health care low back and leg pain s/p L3-5 decompression with interbody fusion and fixation L4-5   I certify that, based on my findings, the following services  are medically necessary home health services Physical therapy   My clinical findings support the need for the above services OTHER SEE COMMENTS   Further, I certify that my clinical findings support that this patient is homebound due to: Ambulates short distances less than 300 feet   To provide the following care/treatments PT    OT   Call MD / Call 911      Comments:   If you experience chest pain or shortness of breath, CALL 911 and be transported to the hospital emergency room.  If you develope a fever above 101 F, pus (white drainage) or increased drainage or redness at the wound, or calf pain, call your surgeon's office.   Constipation Prevention      Comments:   Drink plenty of fluids.  Prune juice may be helpful.  You may use a stool softener, such as Colace (over the counter) 100 mg twice a day.  Use MiraLax (over the counter) for constipation as needed.   Increase activity slowly as tolerated      Discharge instructions      Comments:   Keep incision clean and dry.  Leave steri strips in place.  May shower 5 days from surgery; pat to dry following shower.  May redress with clean, dry dressing if you would like.  Do not apply any lotion/cream/ointment to the incision.   Driving restrictions      Comments:   No driving for 2 weeks.  Dr Shon Baton will discuss addition driving restrictions at your first post-op visit in 2 weeks.   Lifting restrictions      Comments:   No lifting anything greater than 5 pounds.  DO NOT reach overhead (above  shoulder height).  NO bending, stooping or squatting.  Dr. Shon Baton will discuss additional lifting restrictions at your first post-op visit in 2 weeks.        Discharge Plan:  discharge to home with home health   Disposition: stable at the time of DC   Signed: Gwinda Maine for Dr. Venita Lick [facility] [phone] 01/05/2012, 7:59 AM

## 2013-02-13 DIAGNOSIS — F419 Anxiety disorder, unspecified: Secondary | ICD-10-CM | POA: Insufficient documentation

## 2013-02-13 DIAGNOSIS — M5430 Sciatica, unspecified side: Secondary | ICD-10-CM | POA: Insufficient documentation

## 2013-02-13 DIAGNOSIS — M159 Polyosteoarthritis, unspecified: Secondary | ICD-10-CM | POA: Insufficient documentation

## 2013-02-13 DIAGNOSIS — IMO0002 Reserved for concepts with insufficient information to code with codable children: Secondary | ICD-10-CM | POA: Insufficient documentation

## 2013-02-13 DIAGNOSIS — K2100 Gastro-esophageal reflux disease with esophagitis, without bleeding: Secondary | ICD-10-CM | POA: Insufficient documentation

## 2013-02-13 DIAGNOSIS — G44229 Chronic tension-type headache, not intractable: Secondary | ICD-10-CM | POA: Insufficient documentation

## 2013-02-13 DIAGNOSIS — E782 Mixed hyperlipidemia: Secondary | ICD-10-CM | POA: Insufficient documentation

## 2013-02-13 DIAGNOSIS — I10 Essential (primary) hypertension: Secondary | ICD-10-CM | POA: Insufficient documentation

## 2013-02-13 DIAGNOSIS — F330 Major depressive disorder, recurrent, mild: Secondary | ICD-10-CM | POA: Insufficient documentation

## 2013-02-13 DIAGNOSIS — M5030 Other cervical disc degeneration, unspecified cervical region: Secondary | ICD-10-CM | POA: Insufficient documentation

## 2013-07-15 ENCOUNTER — Encounter: Payer: Self-pay | Admitting: Neurology

## 2013-07-15 ENCOUNTER — Ambulatory Visit (INDEPENDENT_AMBULATORY_CARE_PROVIDER_SITE_OTHER): Payer: Medicare Other | Admitting: Neurology

## 2013-07-15 ENCOUNTER — Encounter (INDEPENDENT_AMBULATORY_CARE_PROVIDER_SITE_OTHER): Payer: Self-pay

## 2013-07-15 VITALS — BP 143/79 | HR 71 | Ht 66.0 in | Wt 226.0 lb

## 2013-07-15 DIAGNOSIS — R51 Headache: Secondary | ICD-10-CM

## 2013-07-15 MED ORDER — GABAPENTIN 300 MG PO CAPS: 300.0000 mg | ORAL_CAPSULE | Freq: Three times a day (TID) | ORAL | Status: DC

## 2013-07-15 NOTE — Progress Notes (Signed)
PATIENT: Diana Arellano DOB: [birth year]  HISTORICAL  Diana Arellano is a 68 years old right-handed Caucasian female, who is referred by her primary care physician Dr. Maxwell Marionammy Speer for evaluation of chronic headaches.  She had past medical history of chronic hearing loss, reported a childhood history of measles, she also has past medical history of obesity, hypertension,  Seems 1996, she began to have headaches, always started at a local, occipital region, spreading forward, sometimes to vertex, frontal region, pressure, constant achy pain, also feel bilateral shoulder heaviness,  Over the years, she was treated with different medications, over-the-counter Tylenol, Advil, Aleve, has graduated was his benefit, she has been using heat patch to her shoulders, 2-3 times a day in one week, which has been helpful,  She denies gait difficulty, denies urinary or bowel incontinence, denies shooting pain to her bilateral upper extremity,  She has worsening headache over past 3 years, was treated with gabapentin, titrating dose to 800 mg 3 times a day, methocarbamol 500 mg every day for muscle spasm, initially it was benefit, now is no longer helpful,  She had headache daily moderate to severe for 3 weeks, tightness along bilateral shoulder, she also describes shooting sensation along her spine if she flexed her neck, she has intermittent right hand paresthesia,  She was given prescription of tizanidine 4mg  qhs x few weeks which has helped her sleep, but make her very googy during the daytime.  Previously she also tried Flexeril, without much help, she also could not tolerate the side effect of sleepiness,  REVIEW OF SYSTEMS: Full 14 system review of systems performed and notable only for weight gain, palpation, Marmer, many years, shortness of breath, feeling hot, achy muscles, headache, depression, anxiety, disinterested in activities,  ALLERGIES: Allergies  Allergen Reactions  . Neosporin  [Neomycin-Bacitracin Zn-Polymyx] Itching    inflammation  . Sulfa Antibiotics Itching    inflammation    HOME MEDICATIONS: Current Outpatient Prescriptions on File Prior to Visit  Medication Sig Dispense Refill  . aspirin EC 81 MG tablet Take 162 mg by mouth every morning.      Marland Kitchen. b complex vitamins tablet Take 1 tablet by mouth every evening.      Marland Kitchen. FLUoxetine (PROZAC) 20 MG capsule Take 20 mg by mouth at bedtime.      . gabapentin (NEURONTIN) 800 MG tablet Take 800 mg by mouth 2 (two) times daily.      Marland Kitchen. lisinopril (PRINIVIL,ZESTRIL) 40 MG tablet Take 40 mg by mouth at bedtime.      . Multiple Vitamin (MULTIVITAMIN WITH MINERALS) TABS Take 1 tablet by mouth at bedtime.       Marland Kitchen. omeprazole (PRILOSEC) 40 MG capsule Take 40 mg by mouth every evening.      . simvastatin (ZOCOR) 40 MG tablet Take 400 mg by mouth at bedtime.        No current facility-administered medications on file prior to visit.    PAST MEDICAL HISTORY: Past Medical History  Diagnosis Date  . Complication of anesthesia     diff to start stream  . Anxiety   . Depression   . Arthritis   . GERD (gastroesophageal reflux disease)   . Hypertension     Diana Arellano  . Heart murmur   . Headache(784.0)     hx of    PAST SURGICAL HISTORY: Past Surgical History  Procedure Laterality Date  . Abdominal hysterectomy    . Joint replacement      bil  knees replaced    FAMILY HISTORY: History reviewed. No pertinent family history.  SOCIAL HISTORY:  History   Social History  . Marital Status: Married    Spouse Name: N/A    Number of Children: N/A  . Years of Education: N/A   Occupational History  . Retired as Art gallery manager   Social History Main Topics  . Smoking status: Never Smoker   . Smokeless tobacco: Never Used  . Alcohol Use: Yes     Comment: daily  . Drug Use: No  . Sexual Activity: Not on file   Other Topics Concern  . Not on file   Social History Narrative   Married to [place], no  children   Right handed   16    Drinks decaffeinated drinks    PHYSICAL EXAM   Filed Vitals:   07/15/13 1444  BP: 143/79  Pulse: 71  Height: 5\' 6"  (1.676 m)  Weight: 226 lb (102.513 kg)    Body mass index is 36.49 kg/(m^2).   Generalized: In no acute distress  Neck: Supple, no carotid bruits   Cardiac: Regular rate rhythm  Pulmonary: Clear to auscultation bilaterally  Musculoskeletal: No deformity  Neurological examination  Mentation: Alert oriented to time, place, history taking, and causual conversation  Cranial nerve II-XII: Pupils were equal round reactive to light. Extraocular movements were full.  Visual field were full on confrontational test. Bilateral fundi were sharp.  Facial sensation and strength were normal. Hearing was intact to finger rubbing bilaterally. Uvula tongue midline.  Head turning and shoulder shrug and were normal and symmetric.Tongue protrusion into cheek strength was normal.  Motor: Normal tone, bulk and strength.  Sensory: Intact to fine touch, pinprick, preserved vibratory sensation, and proprioception at toes.  Coordination: Normal finger to nose, heel-to-shin bilaterally there was no truncal ataxia  Gait: Rising up from seated position without assistance, normal stance, without trunk ataxia, moderate stride, good arm swing, smooth turning, able to perform tiptoe, and heel walking without difficulty.   Romberg signs: Negative  Deep tendon reflexes: Brachioradialis 3/3, biceps 3/3, triceps 3/3, patellar 3/3, Achilles 2/2, plantar responses were flexor bilaterally.   DIAGNOSTIC DATA (LABS, IMAGING, TESTING) - I reviewed patient records, labs, notes, testing and imaging myself where available.  Lab Results  Component Value Date   WBC 6.3 12/14/2011   HGB 13.8 12/14/2011   HCT 40.5 12/14/2011   MCV 91.6 12/14/2011   PLT 157 12/14/2011      Component Value Date/Time   NA 142 12/14/2011 1420   K 4.3 12/14/2011 1420   CL 103 12/14/2011  1420   CO2 30 12/14/2011 1420   GLUCOSE 102* 12/14/2011 1420   BUN 14 12/14/2011 1420   CREATININE 0.84 12/14/2011 1420   CALCIUM 9.8 12/14/2011 1420   PROT 7.2 12/14/2011 1420   ALBUMIN 4.0 12/14/2011 1420   AST 27 12/14/2011 1420   ALT 23 12/14/2011 1420   ALKPHOS 124* 12/14/2011 1420   BILITOT 0.4 12/14/2011 1420   GFRNONAA 71* 12/14/2011 1420   GFRAA 83* 12/14/2011 1420   ASSESSMENT AND PLAN  Diana Arellano is a 68 y.o. 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 Tapering of gabapentin, increased tizanidine to 2mg /2 mg/4 mg every day  3. may even consider Botox injection if above measures fail   Levert Feinstein, M.D. Ph.D.  [facility] [address] [place], [place] [address] [phone]

## 2013-07-15 NOTE — Patient Instructions (Signed)
Gradually taper off gabapentin.  You are taking gabapentin 800mg  1/1/1  New prescription 300mg    300/800/800 for 3 days,  300/300/300 for 3 days 300/300 xone week,  300mg  x one week,  Then stop

## 2013-07-24 ENCOUNTER — Ambulatory Visit
Admission: RE | Admit: 2013-07-24 | Discharge: 2013-07-24 | Disposition: A | Discharge status: Home / Self Care | Payer: Medicare Other | Source: Ambulatory Visit | Attending: Neurology | Admitting: Neurology

## 2013-07-24 DIAGNOSIS — R51 Headache: Secondary | ICD-10-CM

## 2013-07-24 DIAGNOSIS — R269 Unspecified abnormalities of gait and mobility: Secondary | ICD-10-CM

## 2013-08-18 ENCOUNTER — Telehealth: Payer: Self-pay | Admitting: *Deleted

## 2013-08-18 ENCOUNTER — Ambulatory Visit (INDEPENDENT_AMBULATORY_CARE_PROVIDER_SITE_OTHER): Payer: Medicare Other | Admitting: Neurology

## 2013-08-18 ENCOUNTER — Encounter: Payer: Self-pay | Admitting: Neurology

## 2013-08-18 VITALS — BP 168/85 | HR 72 | Ht 65.0 in | Wt 228.0 lb

## 2013-08-18 DIAGNOSIS — M4802 Spinal stenosis, cervical region: Secondary | ICD-10-CM | POA: Insufficient documentation

## 2013-08-18 MED ORDER — HYDROCODONE-ACETAMINOPHEN 5-300 MG PO TABS: 1.0000 | ORAL_TABLET | Freq: Three times a day (TID) | ORAL | Status: DC | PRN

## 2013-08-18 MED ORDER — NORTRIPTYLINE HCL 10 MG PO CAPS: ORAL_CAPSULE | ORAL | Status: DC

## 2013-08-18 NOTE — Progress Notes (Signed)
PATIENT: Diana Arellano DOB: [birth year]  HISTORICAL  Diana SalkJudith Bubeck is a 68 years old right-handed Caucasian female, who is referred by her primary care physician Dr. Herb Graysammy Spear for evaluation of chronic headaches.  She had past medical history of chronic hearing loss, reported a childhood history of measles, she also has past medical history of obesity, hypertension,  Since 1996, she began to have headaches, always started at a local, occipital region, spreading forward, sometimes to vertex, frontal region, pressure, constant achy pain, also feel bilateral shoulder heaviness,  Over the years, she was treated with different medications, over-the-counter Tylenol, Advil, Aleve, has graduated was his benefit, she has been using heat patch to her shoulders, 2-3 times a day in one week, which has been helpful,  She denies gait difficulty, denies urinary or bowel incontinence, denies shooting pain to her bilateral upper extremity,  She has worsening headache over past 3 years, was treated with gabapentin, titrating dose to 800 mg 3 times a day, methocarbamol 500 mg every day for muscle spasm, initially it was benefit, now is no longer helpful,  She had headache daily moderate to severe for 3 weeks, tightness along bilateral shoulder,   she has intermittent right hand paresthesia, has a history right hand carpal tunnel syndrome  She was given prescription of tizanidine 4mg  qhs x few weeks which has helped her sleep, but make her very sleepy during the daytime.  Previously she also tried Flexeril, without much help, she also could not tolerate the side effect of sleepiness.  UPDATE April 27th 2015: She has headaches 3-4 times in a week, starting from occipital region, if she flexed her neck, she felt travelling sensation along her spine,   We have reviewed MRI cervical together, which showed prominent spondylitic changes at C4-5 and C5-6 with mild to moderate canal and left greater than right  foraminal narrowing.  REVIEW OF SYSTEMS: Full 14 system review of systems performed and notable only for ringing ears, shortness of breath, palpitation, murmur, heat intolerance, constipation, joint pain, neck pain, neck stiffness, headache, depression    ALLERGIES: Allergies  Allergen Reactions  . Neosporin [Neomycin-Bacitracin Zn-Polymyx] Itching    inflammation  . Sulfa Antibiotics Itching    inflammation    HOME MEDICATIONS: Current Outpatient Prescriptions on File Prior to Visit  Medication Sig Dispense Refill  . ascorbic acid (VITAMIN C) 500 MG tablet Take 500 mg by mouth daily.      Marland Kitchen. aspirin EC 81 MG tablet Take 162 mg by mouth every morning.      Marland Kitchen. b complex vitamins tablet Take 1 tablet by mouth every evening.      . Cholecalciferol (VITAMIN D3) 3000 UNITS TABS Take by mouth daily.      Marland Kitchen. esomeprazole (NEXIUM) 40 MG packet Take 40 mg by mouth daily before breakfast.      . fenofibrate 54 MG tablet Take 54 mg by mouth daily.      Marland Kitchen. FLUoxetine (PROZAC) 20 MG capsule Take 20 mg by mouth at bedtime.      . folic acid (FOLVITE) 800 MCG tablet Take 400 mcg by mouth daily.      Marland Kitchen. gabapentin (NEURONTIN) 300 MG capsule Take 1 capsule (300 mg total) by mouth 3 (three) times daily.  60 capsule  3  . lisinopril (PRINIVIL,ZESTRIL) 40 MG tablet Take 40 mg by mouth at bedtime.      . methocarbamol (ROBAXIN) 500 MG tablet Take 500 mg by mouth 4 (four) times daily.      .Marland Kitchen  Multiple Vitamin (MULTIVITAMIN WITH MINERALS) TABS Take 1 tablet by mouth at bedtime.       . Omega-3 Fatty Acids (FISH OIL) 306 MG CAPS Take by mouth daily.      Marland Kitchen omeprazole (PRILOSEC) 40 MG capsule Take 40 mg by mouth every evening.      . simvastatin (ZOCOR) 40 MG tablet Take 400 mg by mouth at bedtime.        No current facility-administered medications on file prior to visit.    PAST MEDICAL HISTORY: Past Medical History  Diagnosis Date  . Complication of anesthesia     diff to start stream  . Anxiety   .  Depression   . Arthritis   . GERD (gastroesophageal reflux disease)   . Hypertension     tammy spears  . Heart murmur   . Headache(784.0)     hx of    PAST SURGICAL HISTORY: Past Surgical History  Procedure Laterality Date  . Abdominal hysterectomy    . Joint replacement      bil  knees replaced    FAMILY HISTORY: History reviewed. No pertinent family history.  SOCIAL HISTORY:  History   Social History  . Marital Status: Married    Spouse Name: N/A    Number of Children: N/A  . Years of Education: N/A   Occupational History  . Retired as Art gallery manager   Social History Main Topics  . Smoking status: Never Smoker   . Smokeless tobacco: Never Used  . Alcohol Use: Yes     Comment: daily  . Drug Use: No  . Sexual Activity: Not on file   Other Topics Concern  . Not on file   Social History Narrative   Married to [place], no children   Right handed   16    Drinks decaffeinated drinks    PHYSICAL EXAM   Filed Vitals:   08/18/13 1259  BP: 168/85  Pulse: 72  Height: 5\' 5"  (1.651 m)  Weight: 228 lb (103.42 kg)    Body mass index is 37.94 kg/(m^2).   Generalized: In no acute distress  Neck: Supple, no carotid bruits   Cardiac: Regular rate rhythm  Pulmonary: Clear to auscultation bilaterally  Musculoskeletal: No deformity  Neurological examination  Mentation: Alert oriented to time, place, history taking, and causual conversation  Cranial nerve II-XII: Pupils were equal round reactive to light. Extraocular movements were full.  Visual field were full on confrontational test. Bilateral fundi were sharp.  Facial sensation and strength were normal. Hearing was intact to finger rubbing bilaterally. Uvula tongue midline.  Head turning and shoulder shrug and were normal and symmetric.Tongue protrusion into cheek strength was normal.  Motor: Normal tone, bulk and strength.  Sensory: Intact to fine touch, pinprick, preserved vibratory sensation, and  proprioception at toes.  Coordination: Normal finger to nose, heel-to-shin bilaterally there was no truncal ataxia  Gait: Rising up from seated position without assistance, normal stance, without trunk ataxia, moderate stride, good arm swing, smooth turning, able to perform tiptoe, and heel walking without difficulty.   Romberg signs: Negative  Deep tendon reflexes: Brachioradialis 3/3, biceps 3/3, triceps 3/3, patellar 3/3, Achilles 2/2, plantar responses were flexor bilaterally.   DIAGNOSTIC DATA (LABS, IMAGING, TESTING) - I reviewed patient records, labs, notes, testing and imaging myself where available.  Lab Results  Component Value Date   WBC 6.3 12/14/2011   HGB 13.8 12/14/2011   HCT 40.5 12/14/2011   MCV 91.6 12/14/2011   PLT 157  12/14/2011      Component Value Date/Time   NA 142 12/14/2011 1420   K 4.3 12/14/2011 1420   CL 103 12/14/2011 1420   CO2 30 12/14/2011 1420   GLUCOSE 102* 12/14/2011 1420   BUN 14 12/14/2011 1420   CREATININE 0.84 12/14/2011 1420   CALCIUM 9.8 12/14/2011 1420   PROT 7.2 12/14/2011 1420   ALBUMIN 4.0 12/14/2011 1420   AST 27 12/14/2011 1420   ALT 23 12/14/2011 1420   ALKPHOS 124* 12/14/2011 1420   BILITOT 0.4 12/14/2011 1420   GFRNONAA 71* 12/14/2011 1420   GFRAA 83* 12/14/2011 1420   ASSESSMENT AND PLAN  Diana SalkJudith Califf is a 68 y.o. 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   I will refer her to neurosurgeon for possible surgical intervention,  Levert FeinsteinYijun Aryn Kops, M.D. Ph.D.  [facility] [address], [address] [place], [place] [address] [phone]

## 2013-08-18 NOTE — Telephone Encounter (Signed)
Pharmacist @ [facility] called and stated they received rx for Hydrocodone-Acetaminophen 5-300 MG TABS.  Insurance will only pay for 5-325 mg.  Please call and advise.  Thanks

## 2013-08-18 NOTE — Telephone Encounter (Signed)
Patient's insurance will not pay for Hydrocodone/APAP 5/300mg , however, they will pay for 5/325mg .  Okay to change strength?  I will be happy to call them back if approved.  Please advise.  Thank you.

## 2013-08-19 ENCOUNTER — Telehealth: Payer: Self-pay | Admitting: Neurology

## 2013-08-19 NOTE — Telephone Encounter (Signed)
[facility] message that there is a major drug interaction between Pamelor 10mg , (just prescribed) and Prozac, which was prescribed by another doctor.  She wanted to know if the doctor still wants to prescribe the Pamelor.

## 2013-08-20 NOTE — Telephone Encounter (Signed)
[facility] is needing Dr. Terrace ArabiaYan to give them a call concerning this medication. Thanks

## 2013-08-21 NOTE — Telephone Encounter (Signed)
Yes, Ok to change strength, please call them back.

## 2013-08-21 NOTE — Telephone Encounter (Signed)
Pharmacy calling concern about a medication interaction. Please advise

## 2013-08-21 NOTE — Telephone Encounter (Signed)
I called back and spoke with Onalee Huaavid.  They will fil 5/325mg  instead.  I have updated the strength on patients med list.

## 2013-08-21 NOTE — Telephone Encounter (Signed)
Clifton CustardAaron [facility] to [facility], saying that patient's Fluoxetine and Nortriptyline have an interaction and they just wanted to make sure it was okay for patient. Please call and advise.

## 2013-08-22 NOTE — Telephone Encounter (Signed)
I have called [facility], I will stop Nortriptyline, Shanda BumpsJessica, please let patient know. Will talk medications on next follow ups.

## 2014-01-03 ENCOUNTER — Emergency Department (HOSPITAL_COMMUNITY)
Admission: EM | Admit: 2014-01-03 | Discharge: 2014-01-03 | Disposition: A | Discharge status: Home / Self Care | Payer: Medicare Other | Attending: Emergency Medicine | Admitting: Emergency Medicine

## 2014-01-03 ENCOUNTER — Encounter (HOSPITAL_COMMUNITY): Payer: Self-pay | Admitting: Emergency Medicine

## 2014-01-03 DIAGNOSIS — F3289 Other specified depressive episodes: Secondary | ICD-10-CM | POA: Diagnosis not present

## 2014-01-03 DIAGNOSIS — Z79899 Other long term (current) drug therapy: Secondary | ICD-10-CM | POA: Diagnosis not present

## 2014-01-03 DIAGNOSIS — R011 Cardiac murmur, unspecified: Secondary | ICD-10-CM | POA: Insufficient documentation

## 2014-01-03 DIAGNOSIS — Z7982 Long term (current) use of aspirin: Secondary | ICD-10-CM | POA: Insufficient documentation

## 2014-01-03 DIAGNOSIS — K219 Gastro-esophageal reflux disease without esophagitis: Secondary | ICD-10-CM | POA: Insufficient documentation

## 2014-01-03 DIAGNOSIS — I10 Essential (primary) hypertension: Secondary | ICD-10-CM | POA: Diagnosis not present

## 2014-01-03 DIAGNOSIS — N390 Urinary tract infection, site not specified: Secondary | ICD-10-CM | POA: Diagnosis not present

## 2014-01-03 DIAGNOSIS — M129 Arthropathy, unspecified: Secondary | ICD-10-CM | POA: Insufficient documentation

## 2014-01-03 DIAGNOSIS — F411 Generalized anxiety disorder: Secondary | ICD-10-CM | POA: Diagnosis not present

## 2014-01-03 DIAGNOSIS — F329 Major depressive disorder, single episode, unspecified: Secondary | ICD-10-CM | POA: Diagnosis not present

## 2014-01-03 DIAGNOSIS — R300 Dysuria: Secondary | ICD-10-CM | POA: Insufficient documentation

## 2014-01-03 LAB — URINALYSIS, ROUTINE W REFLEX MICROSCOPIC
Bilirubin Urine: NEGATIVE
Glucose, UA: NEGATIVE mg/dL
Ketones, ur: NEGATIVE mg/dL
Nitrite: NEGATIVE
Protein, ur: NEGATIVE mg/dL
Specific Gravity, Urine: 1.007 (ref 1.005–1.030)
Urobilinogen, UA: 0.2 mg/dL (ref 0.0–1.0)
pH: 6.5 (ref 5.0–8.0)

## 2014-01-03 LAB — URINE MICROSCOPIC-ADD ON

## 2014-01-03 MED ORDER — PHENAZOPYRIDINE HCL 100 MG PO TABS: 100.0000 mg | ORAL_TABLET | Freq: Three times a day (TID) | ORAL | Status: DC

## 2014-01-03 MED ORDER — CEPHALEXIN 500 MG PO CAPS: 500.0000 mg | ORAL_CAPSULE | Freq: Two times a day (BID) | ORAL | Status: DC

## 2014-01-03 MED ORDER — ASPIRIN 81 MG PO CHEW
CHEWABLE_TABLET | ORAL | Status: AC
  Filled 2014-01-03: qty 4

## 2014-01-03 MED ORDER — CEPHALEXIN 500 MG PO CAPS
500.0000 mg | ORAL_CAPSULE | Freq: Two times a day (BID) | ORAL | Status: DC
  Administered 2014-01-03: 500 mg via ORAL
  Filled 2014-01-03: qty 1

## 2014-01-03 MED ORDER — PHENAZOPYRIDINE HCL 100 MG PO TABS
100.0000 mg | ORAL_TABLET | Freq: Three times a day (TID) | ORAL | Status: DC
  Administered 2014-01-03: 100 mg via ORAL
  Filled 2014-01-03 (×3): qty 1

## 2014-01-03 NOTE — ED Notes (Addendum)
Pt states that she is concerned that she has a UTI. Pt states that she has dysuria, urinary frequency, and reports seeing blood in her urine. Pt denies abdominal pain, nausea, emesis, vaginal bleeding, and vaginal discharge.

## 2014-01-03 NOTE — ED Provider Notes (Signed)
CSN: [id number]     Arrival date & time 01/03/14  1948 History   First MD Initiated Contact with Patient 01/03/14 2018     Chief Complaint  Patient presents with  . Urinary Frequency  . Dysuria   HPI Patient presents to the emergency room with complaints of any urinary tract infection. The patient states she had UTI many years ago and this feels very similar. She is having urinary frequency, discomfort with urination and has noticed some blood in her urine. She denies any abdominal pain. She has not had any nausea vomiting vaginal bleeding or vaginal discharge. She denies any back pain or fevers. Past Medical History  Diagnosis Date  . Complication of anesthesia     diff to start stream  . Anxiety   . Depression   . Arthritis   . GERD (gastroesophageal reflux disease)   . Hypertension     tammy spears  . Heart murmur   . Headache(784.0)     hx of   Past Surgical History  Procedure Laterality Date  . Abdominal hysterectomy    . Joint replacement      bil  knees replaced   No family history on file. History  Substance Use Topics  . Smoking status: Never Smoker   . Smokeless tobacco: Never Used  . Alcohol Use: Yes     Comment: daily   OB History   Grav Para Term Preterm Abortions TAB SAB Ect Mult Living                 Review of Systems  All other systems reviewed and are negative.     Allergies  Neosporin and Sulfa antibiotics  Home Medications   Prior to Admission medications   Medication Sig Start Date End Date Taking? Authorizing Provider  aspirin EC 81 MG tablet Take 243 mg by mouth every morning.    Yes Historical Provider, MD  b complex vitamins tablet Take 1 tablet by mouth every evening.   Yes Historical Provider, MD  Cholecalciferol (VITAMIN D3) 3000 UNITS TABS Take 3,000 Units by mouth daily.    Yes Historical Provider, MD  Coenzyme Q10 (CO Q 10 PO) Take 1 capsule by mouth daily.   Yes Historical Provider, MD  FLUoxetine (PROZAC) 20 MG capsule Take  20 mg by mouth at bedtime.   Yes Historical Provider, MD  folic acid (FOLVITE) 800 MCG tablet Take 800 mcg by mouth daily.    Yes Historical Provider, MD  gabapentin (NEURONTIN) 600 MG tablet Take 600 mg by mouth 2 (two) times daily.   Yes Historical Provider, MD  lidocaine (LIDODERM) 5 % Place 1 patch onto the skin once. Remove & Discard patch within 12 hours or as directed by MD.  Pt uses pt husband medication   Yes Historical Provider, MD  lisinopril (PRINIVIL,ZESTRIL) 40 MG tablet Take 40 mg by mouth at bedtime.   Yes Historical Provider, MD  MAGNESIUM CARBONATE PO Take 1 tablet by mouth daily.   Yes Historical Provider, MD  methocarbamol (ROBAXIN) 500 MG tablet Take 500 mg by mouth 4 (four) times daily.   Yes Historical Provider, MD  Multiple Vitamin (MULTIVITAMIN WITH MINERALS) TABS Take 1 tablet by mouth at bedtime.    Yes Historical Provider, MD  naproxen sodium (ANAPROX) 220 MG tablet Take 660 mg by mouth 2 (two) times daily as needed (pain).   Yes Historical Provider, MD  omega-3 acid ethyl esters (LOVAZA) 1 G capsule Take 1 g by mouth daily.  Yes Historical Provider, MD  omeprazole (PRILOSEC) 40 MG capsule Take 40 mg by mouth every evening.   Yes Historical Provider, MD  oxymetazoline (AFRIN) 0.05 % nasal spray Place 1 spray into both nostrils 2 (two) times daily as needed for congestion.   Yes Historical Provider, MD  simvastatin (ZOCOR) 40 MG tablet Take 400 mg by mouth at bedtime.    Yes Historical Provider, MD  cephALEXin (KEFLEX) 500 MG capsule Take 1 capsule (500 mg total) by mouth every 12 (twelve) hours. 01/03/14   Linwood Dibbles, MD  phenazopyridine (PYRIDIUM) 100 MG tablet Take 1 tablet (100 mg total) by mouth 3 (three) times daily with meals. 01/04/14   Linwood Dibbles, MD   BP 165/73  Pulse 102  Temp(Src) 98.6 F (37 C) (Oral)  Resp 18  SpO2 99% Physical Exam  Nursing note and vitals reviewed. Constitutional: She appears well-developed and well-nourished. No distress.  HENT:   Head: Normocephalic and atraumatic.  Right Ear: External ear normal.  Left Ear: External ear normal.  Eyes: Conjunctivae are normal. Right eye exhibits no discharge. Left eye exhibits no discharge. No scleral icterus.  Neck: Neck supple. No tracheal deviation present.  Cardiovascular: Normal rate.   Pulmonary/Chest: Effort normal. No stridor. No respiratory distress.  Musculoskeletal: She exhibits no edema.  Neurological: She is alert. Cranial nerve deficit: no gross deficits.  Skin: Skin is warm and dry. No rash noted.  Psychiatric: She has a normal mood and affect.    ED Course  Procedures (including critical care time) Labs Review Labs Reviewed  URINALYSIS, ROUTINE W REFLEX MICROSCOPIC - Abnormal; Notable for the following:    APPearance CLOUDY (*)    Hgb urine dipstick LARGE (*)    Leukocytes, UA LARGE (*)    All other components within normal limits  URINE MICROSCOPIC-ADD ON - Abnormal; Notable for the following:    Bacteria, UA FEW (*)    All other components within normal limits  URINE CULTURE     MDM   Final diagnoses:  UTI (lower urinary tract infection)    Pt has a uti.  No systemic symptoms.  Will dc home with rx for keflex and pyridium.  Culture ordered.    Linwood Dibbles, MD 01/03/14 2147

## 2014-01-03 NOTE — Discharge Instructions (Signed)

## 2014-01-05 LAB — URINE CULTURE
Colony Count: NO GROWTH
Culture: NO GROWTH

## 2014-05-07 DIAGNOSIS — M62838 Other muscle spasm: Secondary | ICD-10-CM | POA: Insufficient documentation

## 2015-01-15 LAB — HM COLONOSCOPY

## 2015-04-25 HISTORY — PX: CARPAL TUNNEL RELEASE: SHX101

## 2016-10-04 DIAGNOSIS — I10 Essential (primary) hypertension: Secondary | ICD-10-CM | POA: Diagnosis not present

## 2016-10-04 DIAGNOSIS — E785 Hyperlipidemia, unspecified: Secondary | ICD-10-CM | POA: Diagnosis not present

## 2016-10-04 DIAGNOSIS — M7711 Lateral epicondylitis, right elbow: Secondary | ICD-10-CM | POA: Diagnosis not present

## 2016-10-04 DIAGNOSIS — M1990 Unspecified osteoarthritis, unspecified site: Secondary | ICD-10-CM | POA: Diagnosis not present

## 2017-01-11 DIAGNOSIS — M1990 Unspecified osteoarthritis, unspecified site: Secondary | ICD-10-CM | POA: Diagnosis not present

## 2017-02-22 DIAGNOSIS — Z23 Encounter for immunization: Secondary | ICD-10-CM | POA: Diagnosis not present

## 2017-03-07 DIAGNOSIS — M7711 Lateral epicondylitis, right elbow: Secondary | ICD-10-CM | POA: Diagnosis not present

## 2017-06-26 DIAGNOSIS — H2513 Age-related nuclear cataract, bilateral: Secondary | ICD-10-CM | POA: Diagnosis not present

## 2017-06-26 DIAGNOSIS — H2511 Age-related nuclear cataract, right eye: Secondary | ICD-10-CM | POA: Diagnosis not present

## 2017-06-26 DIAGNOSIS — H40033 Anatomical narrow angle, bilateral: Secondary | ICD-10-CM | POA: Diagnosis not present

## 2017-06-26 DIAGNOSIS — H40031 Anatomical narrow angle, right eye: Secondary | ICD-10-CM | POA: Diagnosis not present

## 2017-06-26 DIAGNOSIS — H25013 Cortical age-related cataract, bilateral: Secondary | ICD-10-CM | POA: Diagnosis not present

## 2017-06-26 DIAGNOSIS — H25043 Posterior subcapsular polar age-related cataract, bilateral: Secondary | ICD-10-CM | POA: Diagnosis not present

## 2017-07-17 DIAGNOSIS — H40032 Anatomical narrow angle, left eye: Secondary | ICD-10-CM | POA: Diagnosis not present

## 2017-07-17 DIAGNOSIS — H40033 Anatomical narrow angle, bilateral: Secondary | ICD-10-CM | POA: Diagnosis not present

## 2017-08-20 DIAGNOSIS — H2513 Age-related nuclear cataract, bilateral: Secondary | ICD-10-CM | POA: Diagnosis not present

## 2017-08-20 DIAGNOSIS — H2511 Age-related nuclear cataract, right eye: Secondary | ICD-10-CM | POA: Diagnosis not present

## 2017-08-20 HISTORY — PX: CATARACT EXTRACTION W/ INTRAOCULAR LENS IMPLANT: SHX1309

## 2017-08-29 DIAGNOSIS — E785 Hyperlipidemia, unspecified: Secondary | ICD-10-CM | POA: Diagnosis not present

## 2017-08-29 DIAGNOSIS — I10 Essential (primary) hypertension: Secondary | ICD-10-CM | POA: Diagnosis not present

## 2017-08-29 DIAGNOSIS — M549 Dorsalgia, unspecified: Secondary | ICD-10-CM | POA: Diagnosis not present

## 2017-08-29 DIAGNOSIS — F419 Anxiety disorder, unspecified: Secondary | ICD-10-CM | POA: Diagnosis not present

## 2017-08-29 DIAGNOSIS — M1990 Unspecified osteoarthritis, unspecified site: Secondary | ICD-10-CM | POA: Diagnosis not present

## 2017-08-29 DIAGNOSIS — K219 Gastro-esophageal reflux disease without esophagitis: Secondary | ICD-10-CM | POA: Diagnosis not present

## 2017-09-10 DIAGNOSIS — H2512 Age-related nuclear cataract, left eye: Secondary | ICD-10-CM | POA: Diagnosis not present

## 2017-09-10 HISTORY — PX: CATARACT EXTRACTION W/ INTRAOCULAR LENS IMPLANT: SHX1309

## 2017-09-11 DIAGNOSIS — H2513 Age-related nuclear cataract, bilateral: Secondary | ICD-10-CM | POA: Diagnosis not present

## 2018-01-14 DIAGNOSIS — Z23 Encounter for immunization: Secondary | ICD-10-CM | POA: Diagnosis not present

## 2018-03-06 DIAGNOSIS — K219 Gastro-esophageal reflux disease without esophagitis: Secondary | ICD-10-CM | POA: Diagnosis not present

## 2018-03-06 DIAGNOSIS — R7309 Other abnormal glucose: Secondary | ICD-10-CM | POA: Diagnosis not present

## 2018-03-06 DIAGNOSIS — I10 Essential (primary) hypertension: Secondary | ICD-10-CM | POA: Diagnosis not present

## 2018-03-06 DIAGNOSIS — Z1239 Encounter for other screening for malignant neoplasm of breast: Secondary | ICD-10-CM | POA: Diagnosis not present

## 2018-03-06 DIAGNOSIS — Z0000 Encounter for general adult medical examination without abnormal findings: Secondary | ICD-10-CM | POA: Diagnosis not present

## 2018-03-06 DIAGNOSIS — M1990 Unspecified osteoarthritis, unspecified site: Secondary | ICD-10-CM | POA: Diagnosis not present

## 2018-03-06 DIAGNOSIS — E782 Mixed hyperlipidemia: Secondary | ICD-10-CM | POA: Diagnosis not present

## 2018-03-06 DIAGNOSIS — F419 Anxiety disorder, unspecified: Secondary | ICD-10-CM | POA: Diagnosis not present

## 2018-03-06 LAB — HEMOGLOBIN A1C: Hemoglobin A1C: 5.4

## 2018-03-11 ENCOUNTER — Other Ambulatory Visit: Payer: Self-pay | Admitting: Family Medicine

## 2018-03-11 DIAGNOSIS — Z1231 Encounter for screening mammogram for malignant neoplasm of breast: Secondary | ICD-10-CM

## 2019-02-10 DIAGNOSIS — Z23 Encounter for immunization: Secondary | ICD-10-CM | POA: Diagnosis not present

## 2019-04-30 DIAGNOSIS — Z20828 Contact with and (suspected) exposure to other viral communicable diseases: Secondary | ICD-10-CM | POA: Diagnosis not present

## 2019-05-22 DIAGNOSIS — L304 Erythema intertrigo: Secondary | ICD-10-CM | POA: Diagnosis not present

## 2019-05-22 DIAGNOSIS — I10 Essential (primary) hypertension: Secondary | ICD-10-CM | POA: Diagnosis not present

## 2019-05-22 DIAGNOSIS — G4701 Insomnia due to medical condition: Secondary | ICD-10-CM | POA: Diagnosis not present

## 2019-05-22 DIAGNOSIS — M1990 Unspecified osteoarthritis, unspecified site: Secondary | ICD-10-CM | POA: Diagnosis not present

## 2019-05-22 DIAGNOSIS — E782 Mixed hyperlipidemia: Secondary | ICD-10-CM | POA: Diagnosis not present

## 2019-06-07 DIAGNOSIS — Z23 Encounter for immunization: Secondary | ICD-10-CM | POA: Diagnosis not present

## 2019-07-05 DIAGNOSIS — Z23 Encounter for immunization: Secondary | ICD-10-CM | POA: Diagnosis not present

## 2019-07-22 DIAGNOSIS — M2550 Pain in unspecified joint: Secondary | ICD-10-CM | POA: Diagnosis not present

## 2019-07-22 DIAGNOSIS — G894 Chronic pain syndrome: Secondary | ICD-10-CM | POA: Diagnosis not present

## 2019-07-22 DIAGNOSIS — Z79899 Other long term (current) drug therapy: Secondary | ICD-10-CM | POA: Diagnosis not present

## 2019-07-22 DIAGNOSIS — M542 Cervicalgia: Secondary | ICD-10-CM | POA: Diagnosis not present

## 2019-07-22 DIAGNOSIS — Z79891 Long term (current) use of opiate analgesic: Secondary | ICD-10-CM | POA: Diagnosis not present

## 2019-07-22 DIAGNOSIS — M545 Low back pain: Secondary | ICD-10-CM | POA: Diagnosis not present

## 2019-08-05 ENCOUNTER — Ambulatory Visit
Admission: RE | Admit: 2019-08-05 | Discharge: 2019-08-05 | Disposition: A | Discharge status: Home / Self Care | Payer: Medicare Other | Source: Ambulatory Visit | Attending: Physician Assistant | Admitting: Physician Assistant

## 2019-08-05 ENCOUNTER — Other Ambulatory Visit: Payer: Self-pay | Admitting: Physician Assistant

## 2019-08-05 DIAGNOSIS — M79642 Pain in left hand: Secondary | ICD-10-CM

## 2019-08-05 DIAGNOSIS — G894 Chronic pain syndrome: Secondary | ICD-10-CM

## 2019-08-05 DIAGNOSIS — M542 Cervicalgia: Secondary | ICD-10-CM

## 2019-08-05 DIAGNOSIS — M545 Low back pain, unspecified: Secondary | ICD-10-CM

## 2019-08-05 IMAGING — CR DG CERVICAL SPINE 2 OR 3 VIEWS
3 series · 3 of 3 positions shown · non-contrast
Comparison: Cervical spine MRI dated [DATE].

CLINICAL DATA: 73-year-old female with chronic neck pain. No known
injury. Right-sided shoulder pain.

EXAM:
CERVICAL SPINE - 2-3 VIEW

[w cervical spine lat]
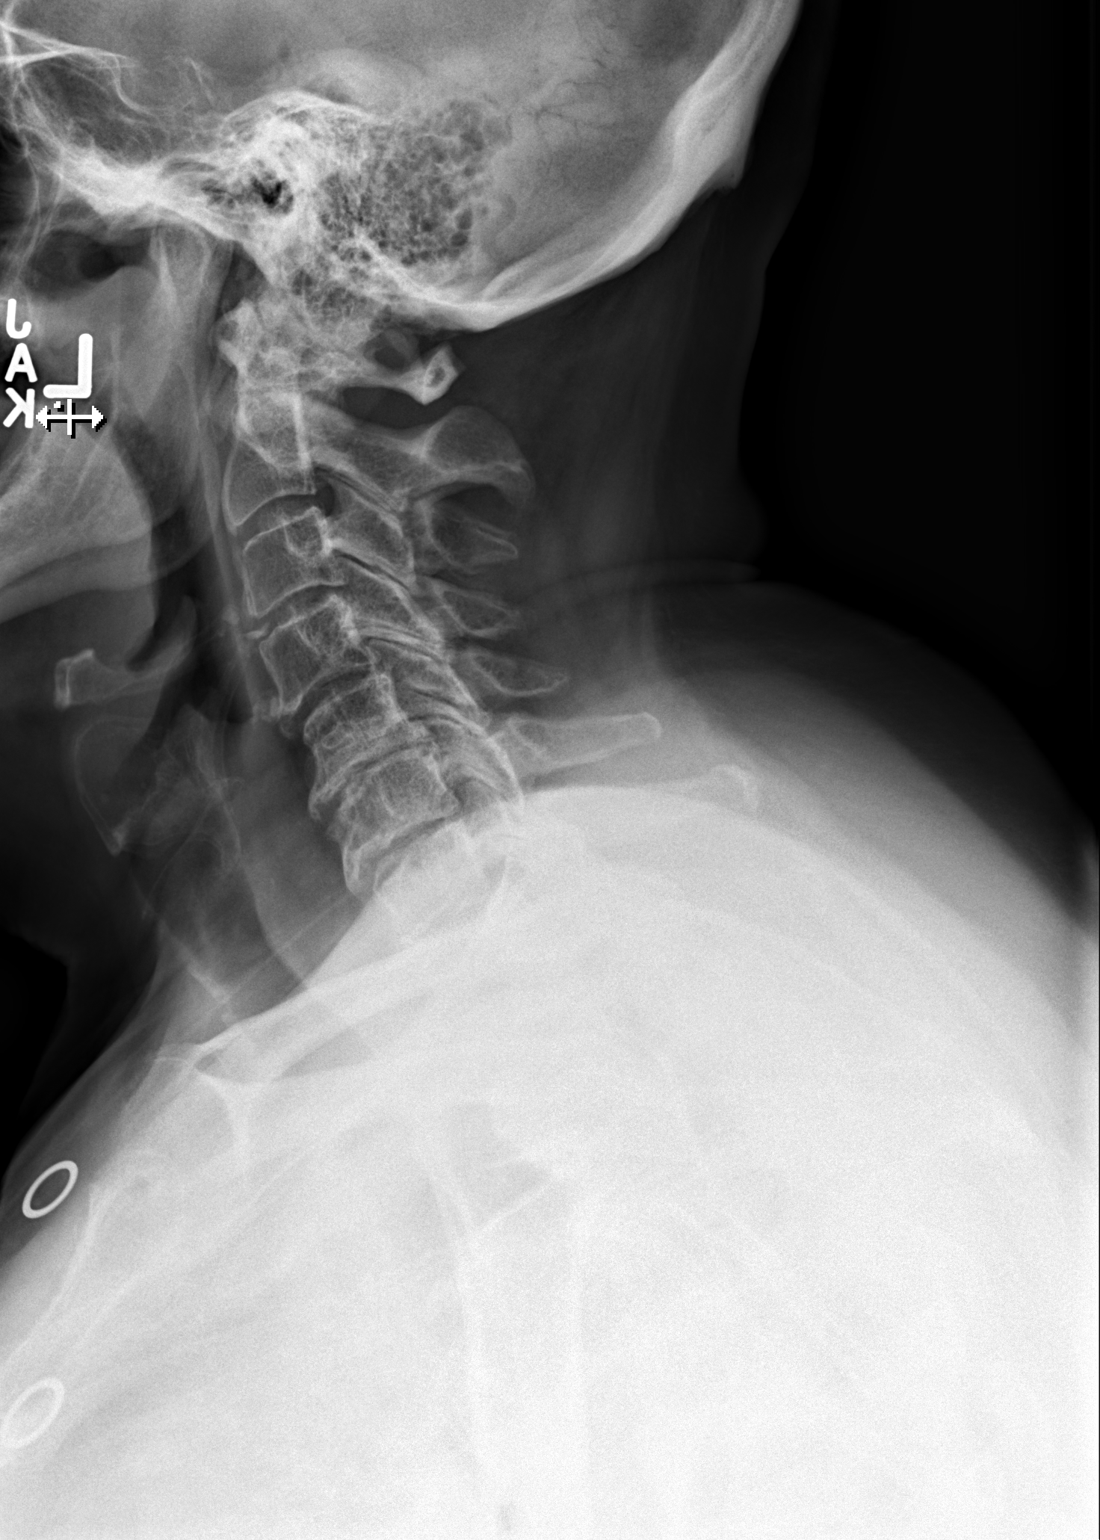

[w cervical spine ap (1 of 2)]
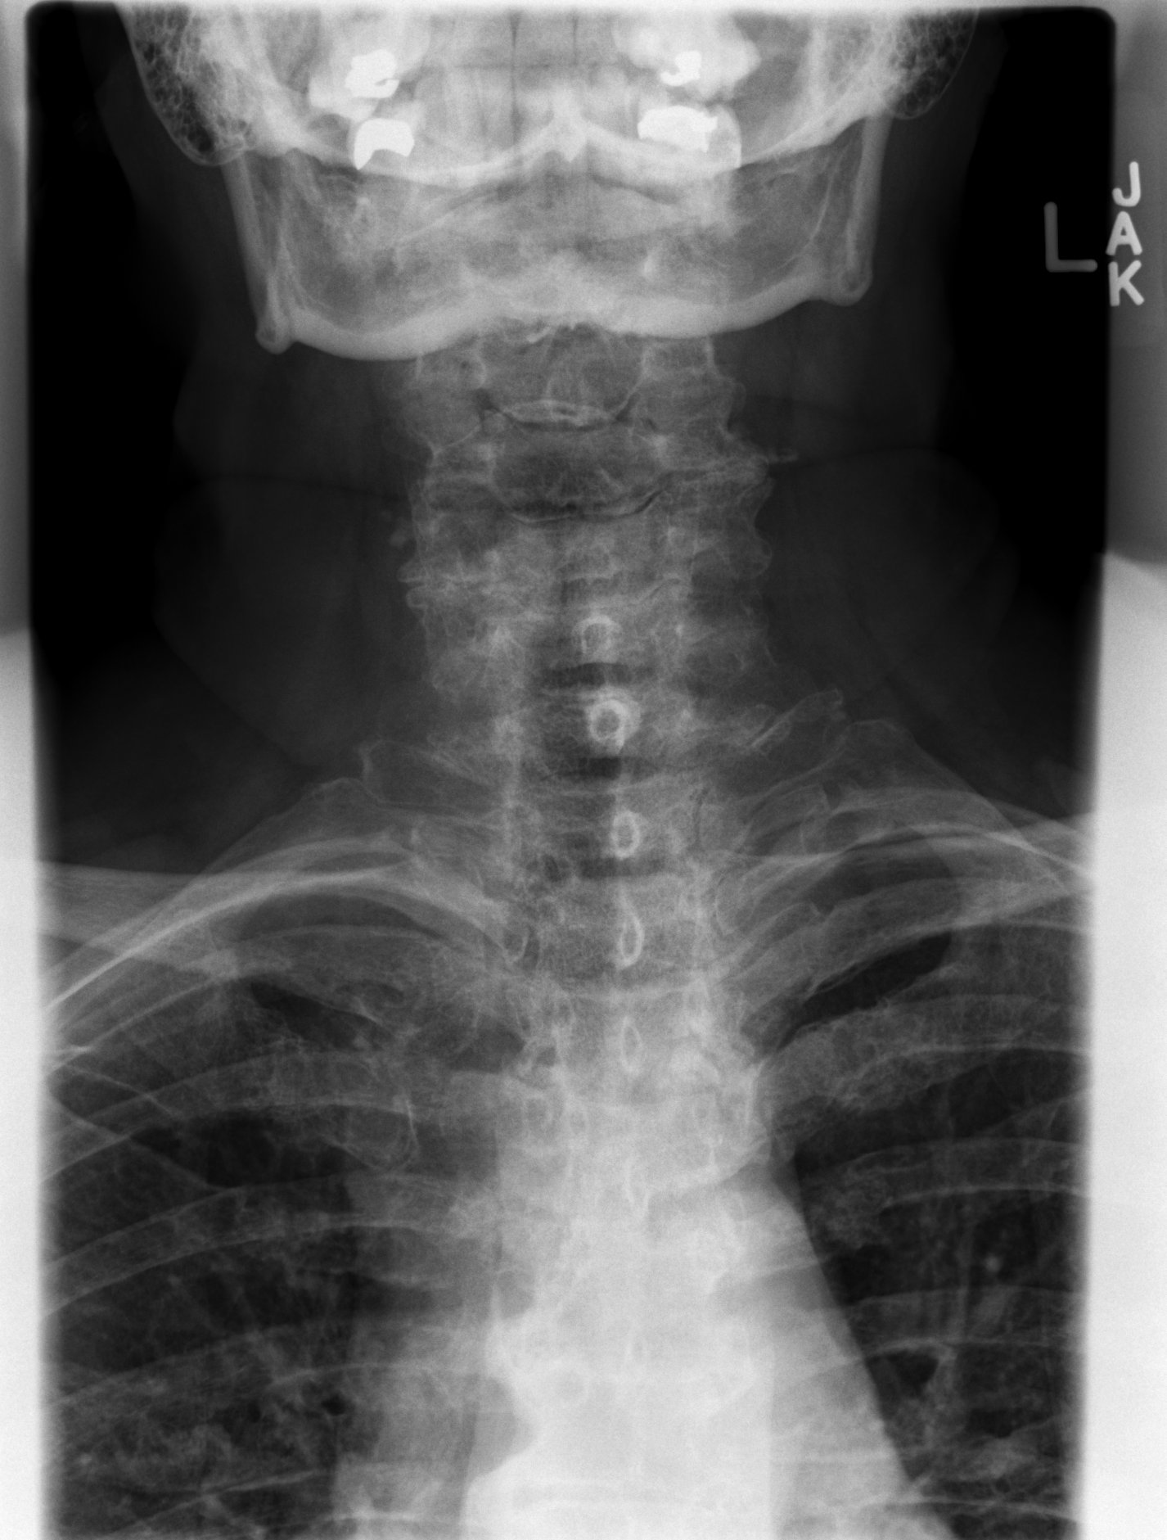

[w cervical spine ap (2 of 2)]
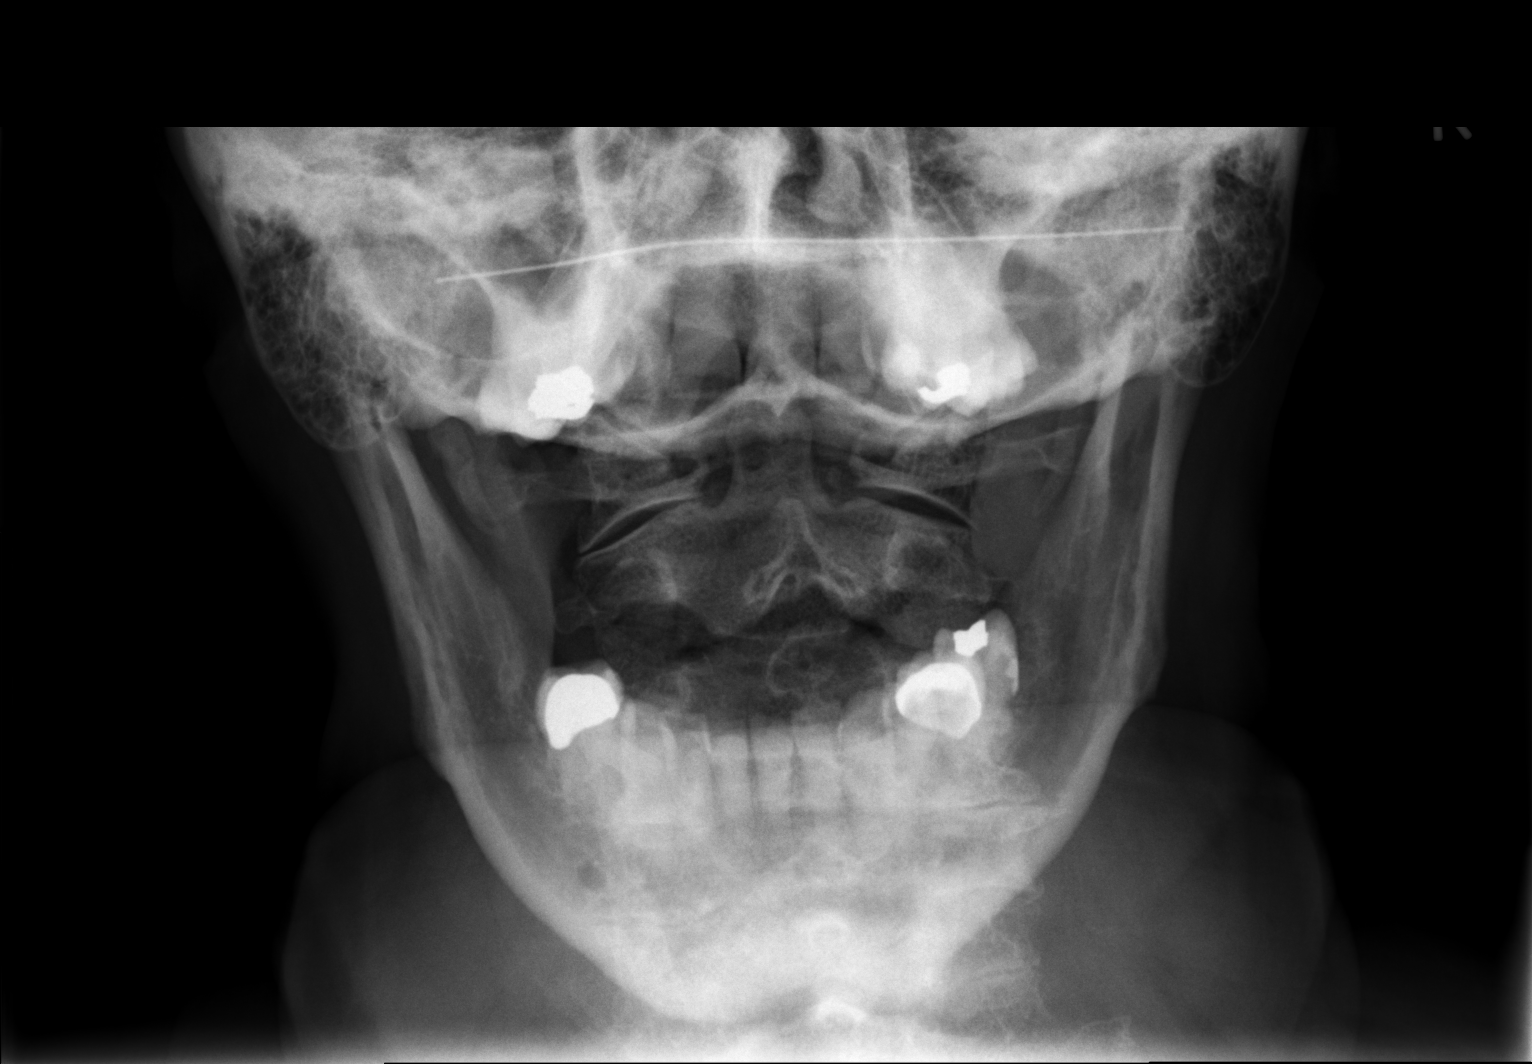

[3 of 3 positions shown; findings below may reference images not displayed]

FINDINGS: There is no acute fracture or subluxation of the cervical spine. The
bones are osteopenic. There are multilevel degenerative changes most
prominent at C5-C6 and C6-C7 with joint space narrowing and anterior
spurring and osteophyte. There multilevel facet arthropathy. The
visualized posterior elements and odontoid are intact. There is
anatomic alignment of the lateral masses of C1 and C2. The soft
tissues are unremarkable.
IMPRESSION: 1. No acute fracture or subluxation.
2. Degenerative changes.

## 2019-08-05 IMAGING — CR DG THORACIC SPINE 3V
3 series · 3 of 3 positions shown · non-contrast
Comparison: None.

CLINICAL DATA: Chronic back pain.

EXAM:
THORACIC SPINE - 3 VIEWS

[t thoracic spine ap]
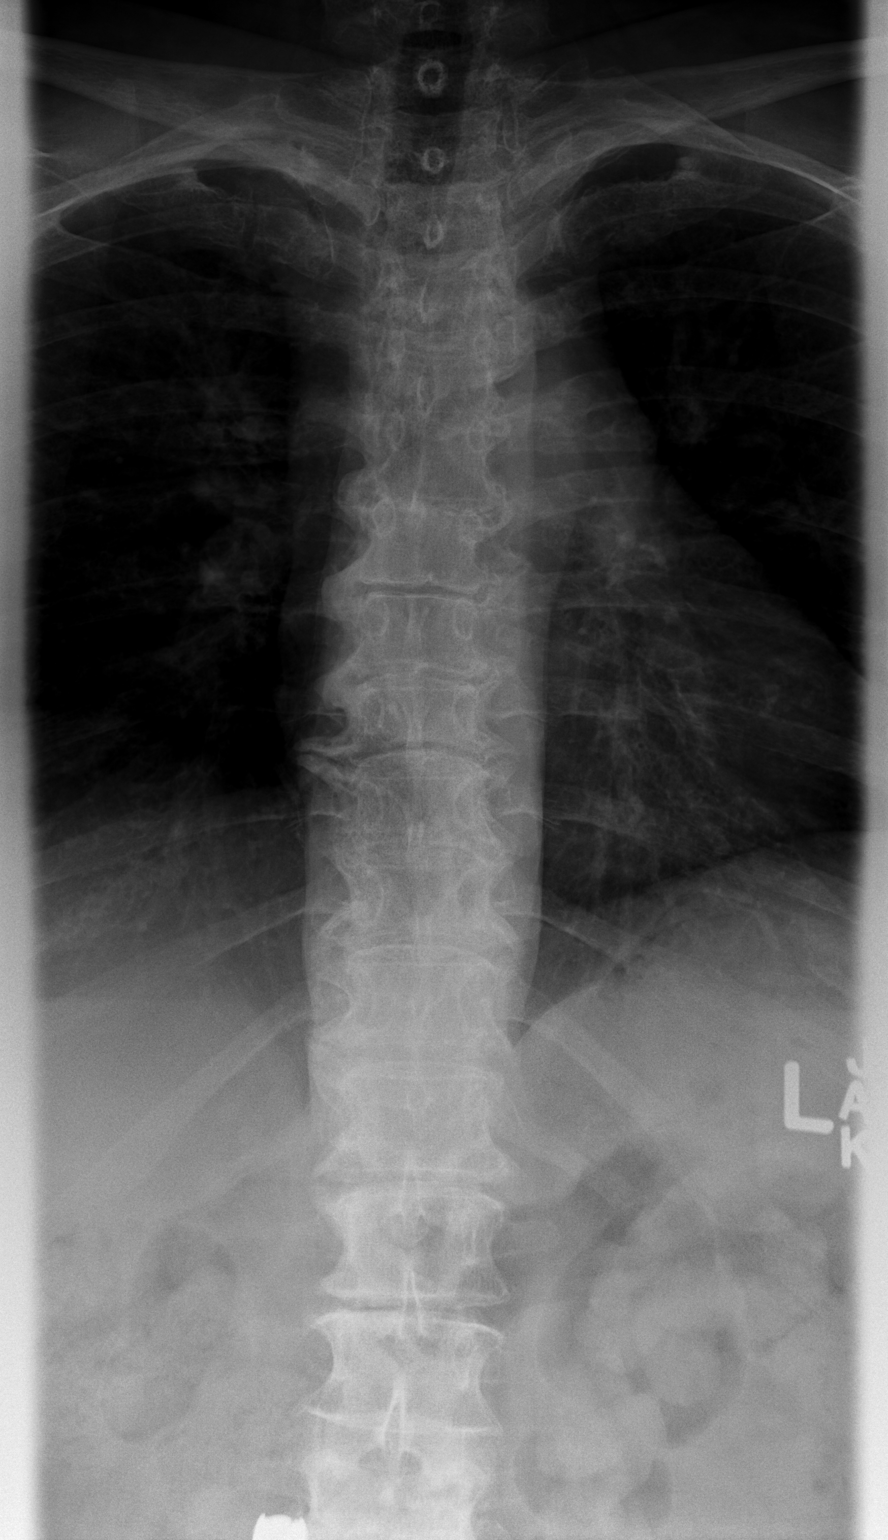

[t thoracic breathing lat]
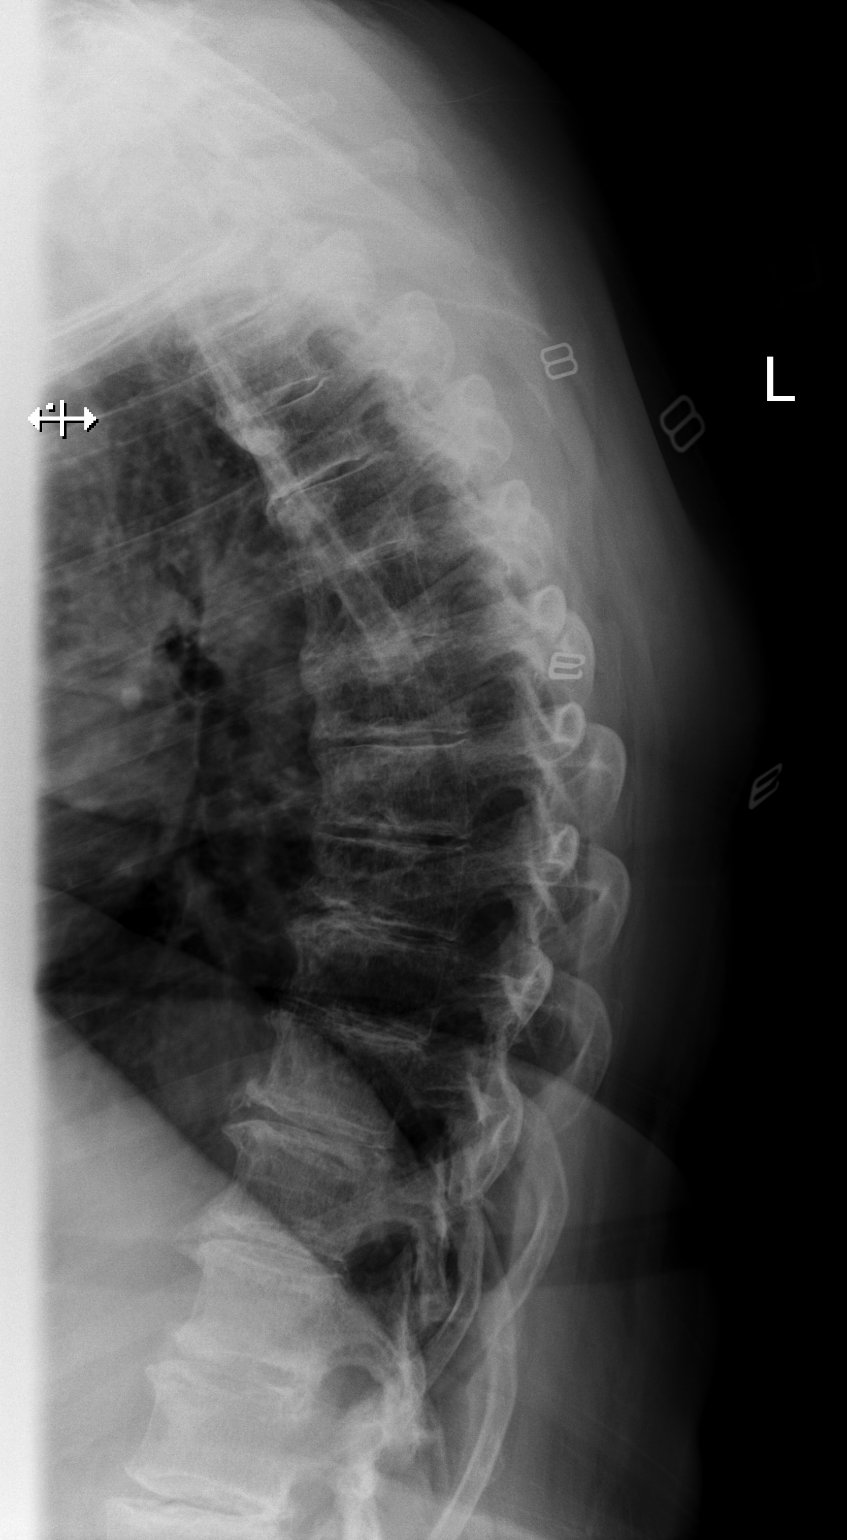

[t thoracic swimmers]
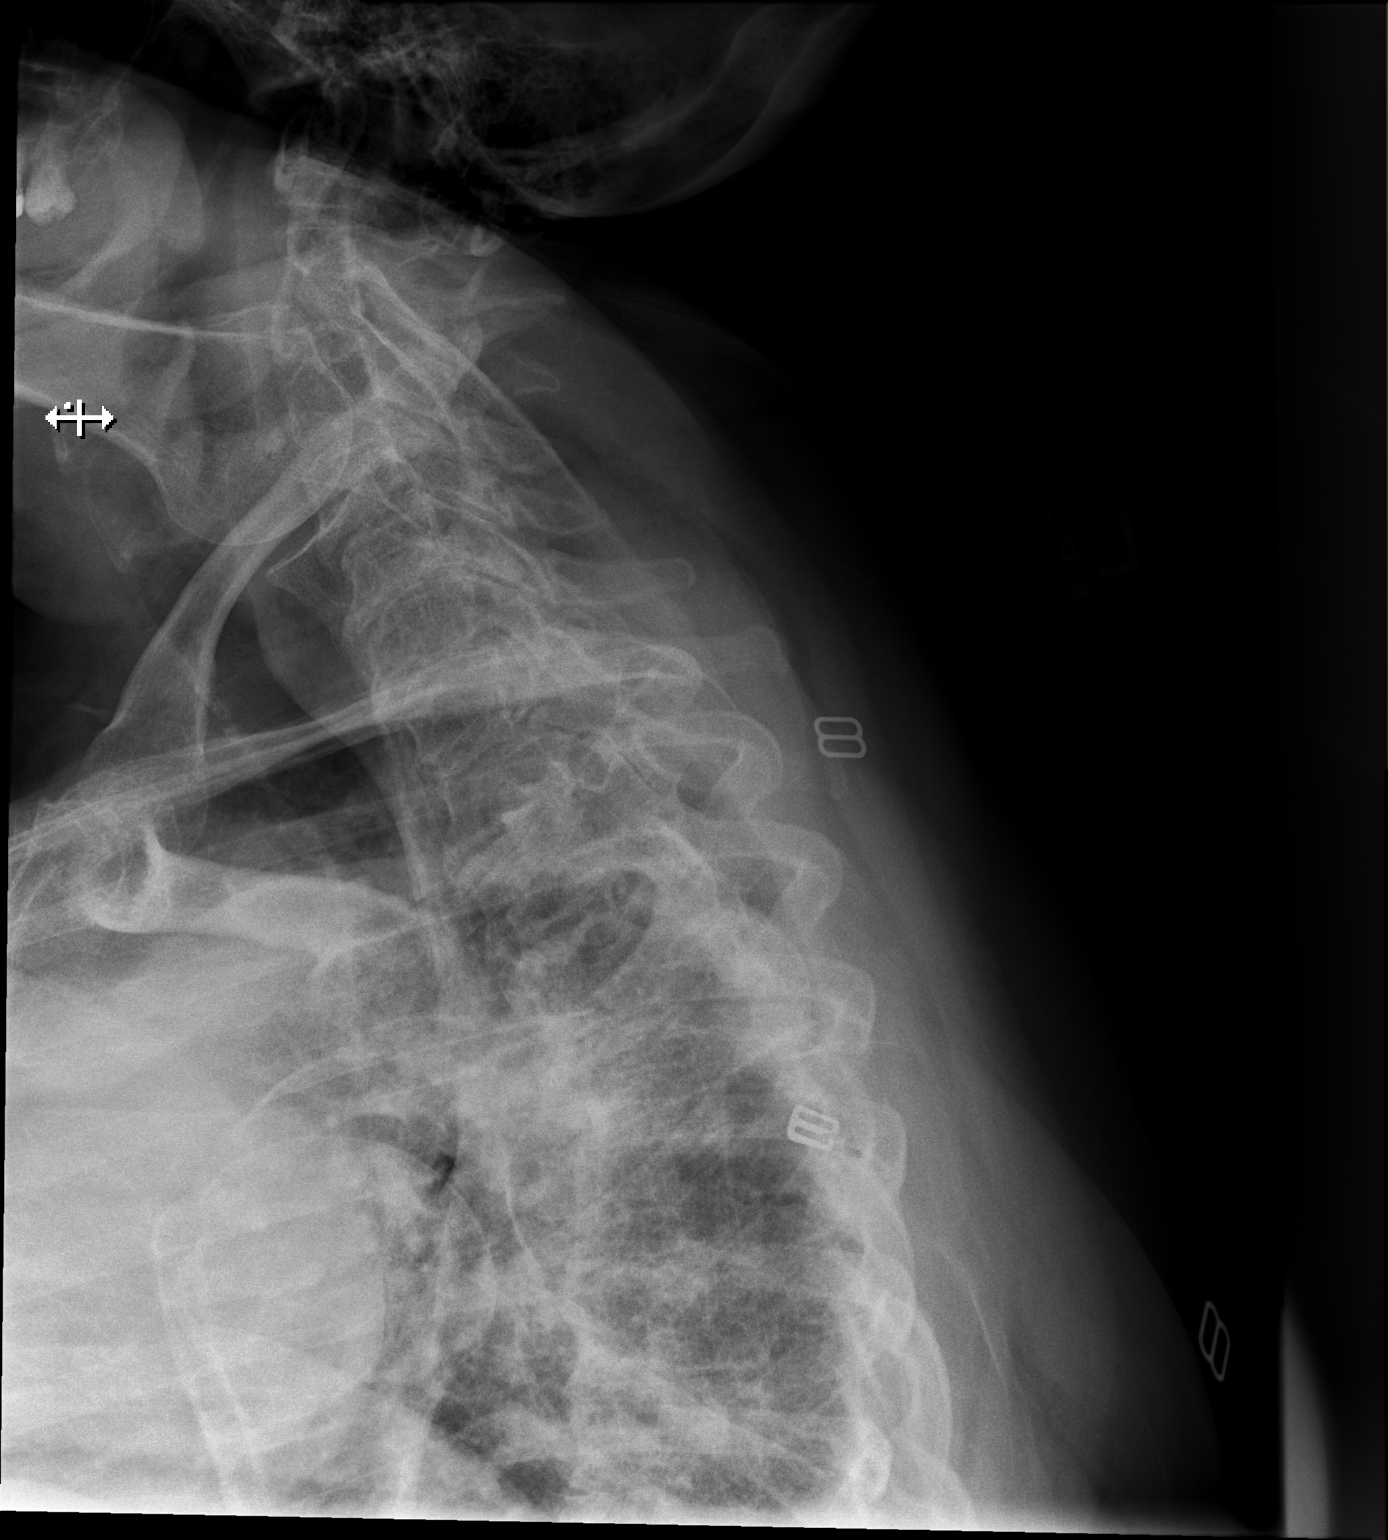

[3 of 3 positions shown; findings below may reference images not displayed]

FINDINGS: There is no evidence of thoracic spine fracture. Alignment is
normal. Moderate severity multilevel endplate sclerosis is seen
throughout the thoracic spine. Mild-to-moderate severity multilevel
intervertebral disc space narrowing is also seen.
IMPRESSION: 1. No acute findings in the thoracic spine.
2. Moderate severity multilevel degenerative disc disease.

## 2019-08-05 IMAGING — CR DG LUMBAR SPINE COMPLETE 4+V
5 series · 5 of 5 positions shown · non-contrast
Comparison: None.

CLINICAL DATA: Chronic back pain.

EXAM:
LUMBAR SPINE - COMPLETE 4+ VIEW

[t lumbar spine ap]
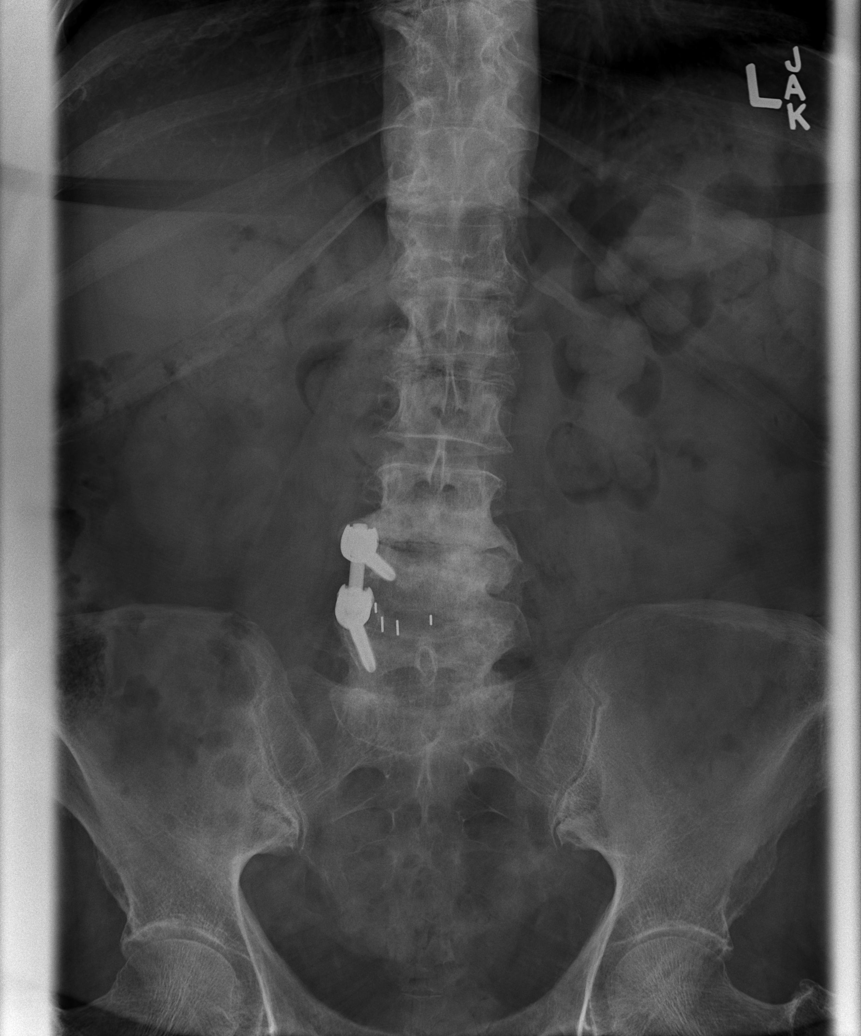

[t lumbar spine obl (1 of 2)]
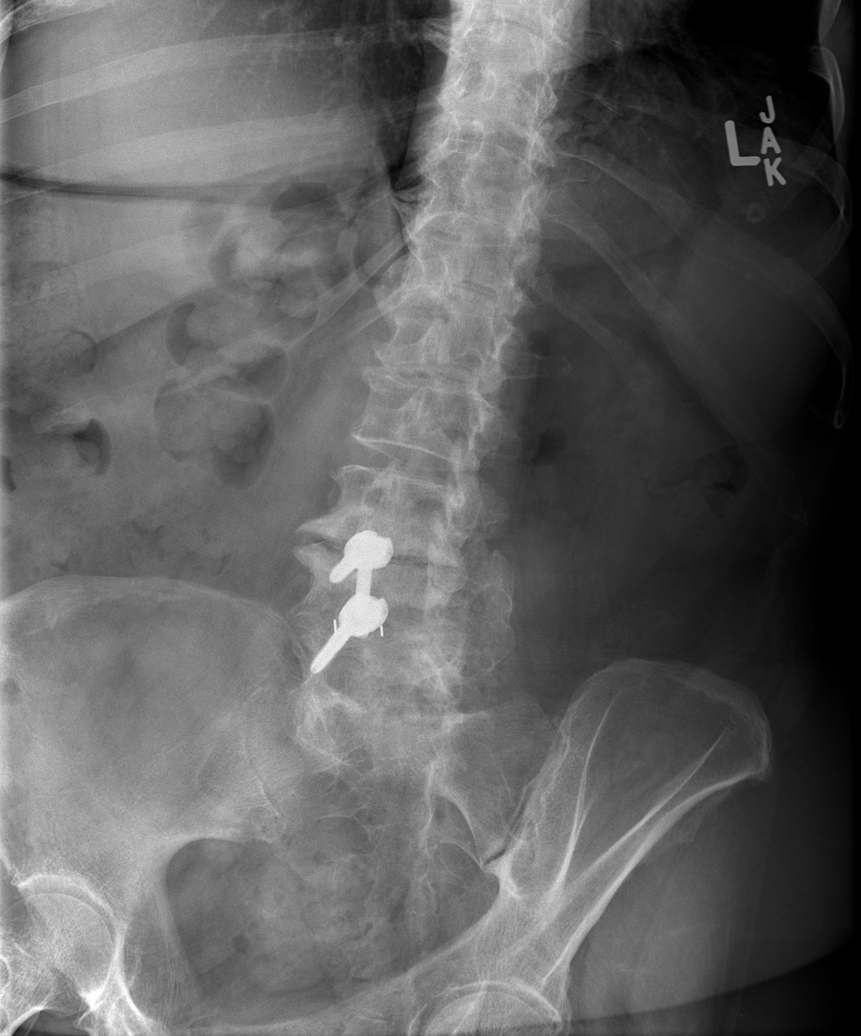

[t lumbar spine obl (2 of 2)]
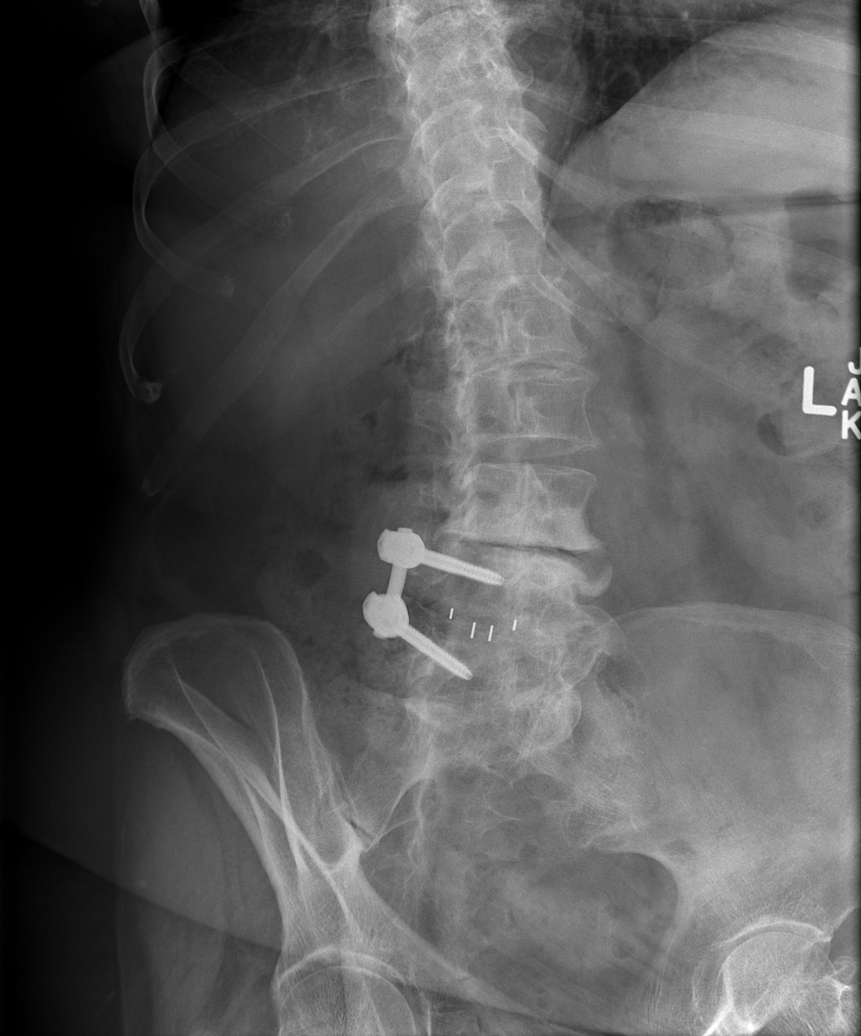

[t lumbar spine lat]
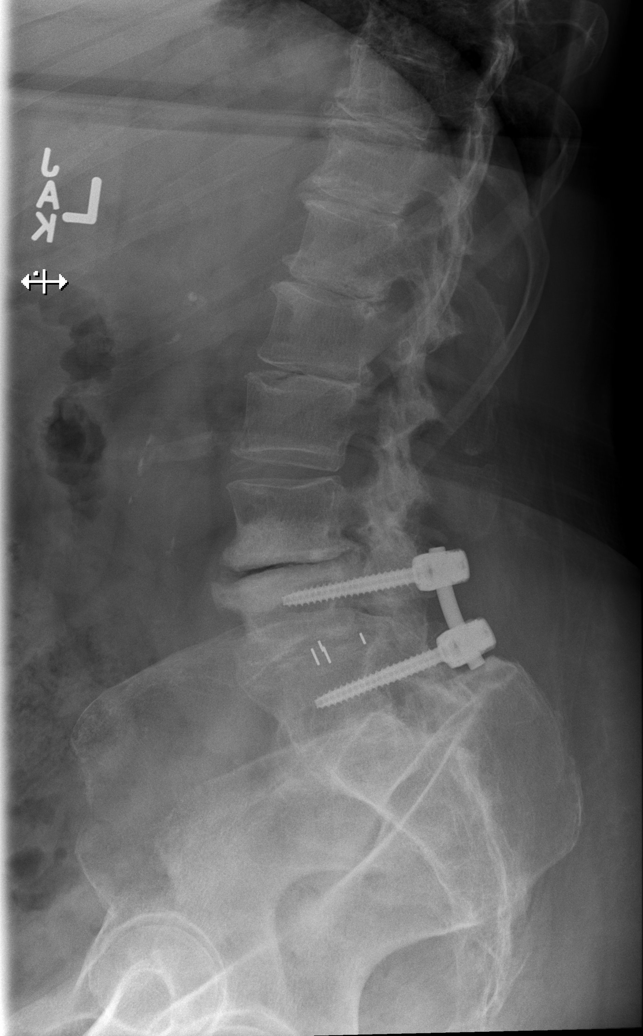

[t lumbar l-5 s-1 spot]
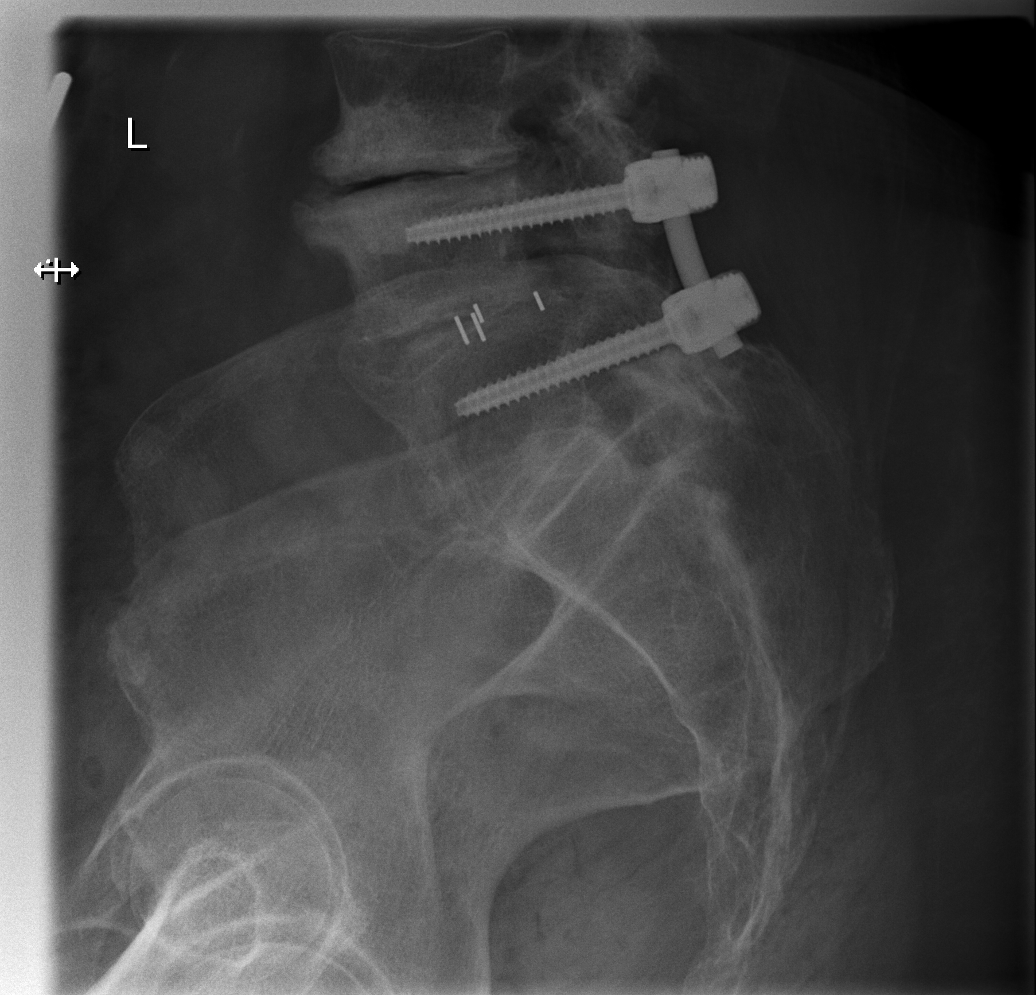

[5 of 5 positions shown; findings below may reference images not displayed]

FINDINGS: There is no evidence of lumbar spine fracture. Alignment is normal.
Radiopaque pedicle screws are seen at the levels of the L4 and L5
vertebral bodies on the right. Marked severity endplate sclerosis is
seen at the levels of L3-L4, L4-L5 and L5-S1. Moderate to marked
severity intervertebral disc space narrowing is also seen at these
levels mild to moderate severity intervertebral disc space narrowing
is seen at the level of L1-L2.
IMPRESSION: 1. Postoperative changes at the levels of L4 and L5.
2. Advanced degenerative disc disease at L3-L4, L4-L5 and L5-S1.

## 2019-08-07 DIAGNOSIS — R350 Frequency of micturition: Secondary | ICD-10-CM | POA: Diagnosis not present

## 2019-08-19 DIAGNOSIS — G894 Chronic pain syndrome: Secondary | ICD-10-CM | POA: Diagnosis not present

## 2019-08-19 DIAGNOSIS — M545 Low back pain: Secondary | ICD-10-CM | POA: Diagnosis not present

## 2019-08-19 DIAGNOSIS — M19011 Primary osteoarthritis, right shoulder: Secondary | ICD-10-CM | POA: Diagnosis not present

## 2019-08-19 DIAGNOSIS — M2550 Pain in unspecified joint: Secondary | ICD-10-CM | POA: Diagnosis not present

## 2019-09-09 ENCOUNTER — Other Ambulatory Visit: Payer: Self-pay | Admitting: Physician Assistant

## 2019-09-09 ENCOUNTER — Ambulatory Visit
Admission: RE | Admit: 2019-09-09 | Discharge: 2019-09-09 | Disposition: A | Discharge status: Home / Self Care | Payer: Medicare Other | Source: Ambulatory Visit | Attending: Physician Assistant | Admitting: Physician Assistant

## 2019-09-09 ENCOUNTER — Other Ambulatory Visit: Payer: Self-pay

## 2019-09-09 DIAGNOSIS — M25511 Pain in right shoulder: Secondary | ICD-10-CM | POA: Diagnosis not present

## 2019-09-09 DIAGNOSIS — M19011 Primary osteoarthritis, right shoulder: Secondary | ICD-10-CM

## 2019-09-09 IMAGING — CR DG SHOULDER 2+V*R*
2 series · 2 of 2 positions shown · non-contrast
Comparison: None.

CLINICAL DATA: Chronic right shoulder pain without known injury.

EXAM:
RIGHT SHOULDER - 2+ VIEW

[w shoulder grashey right]
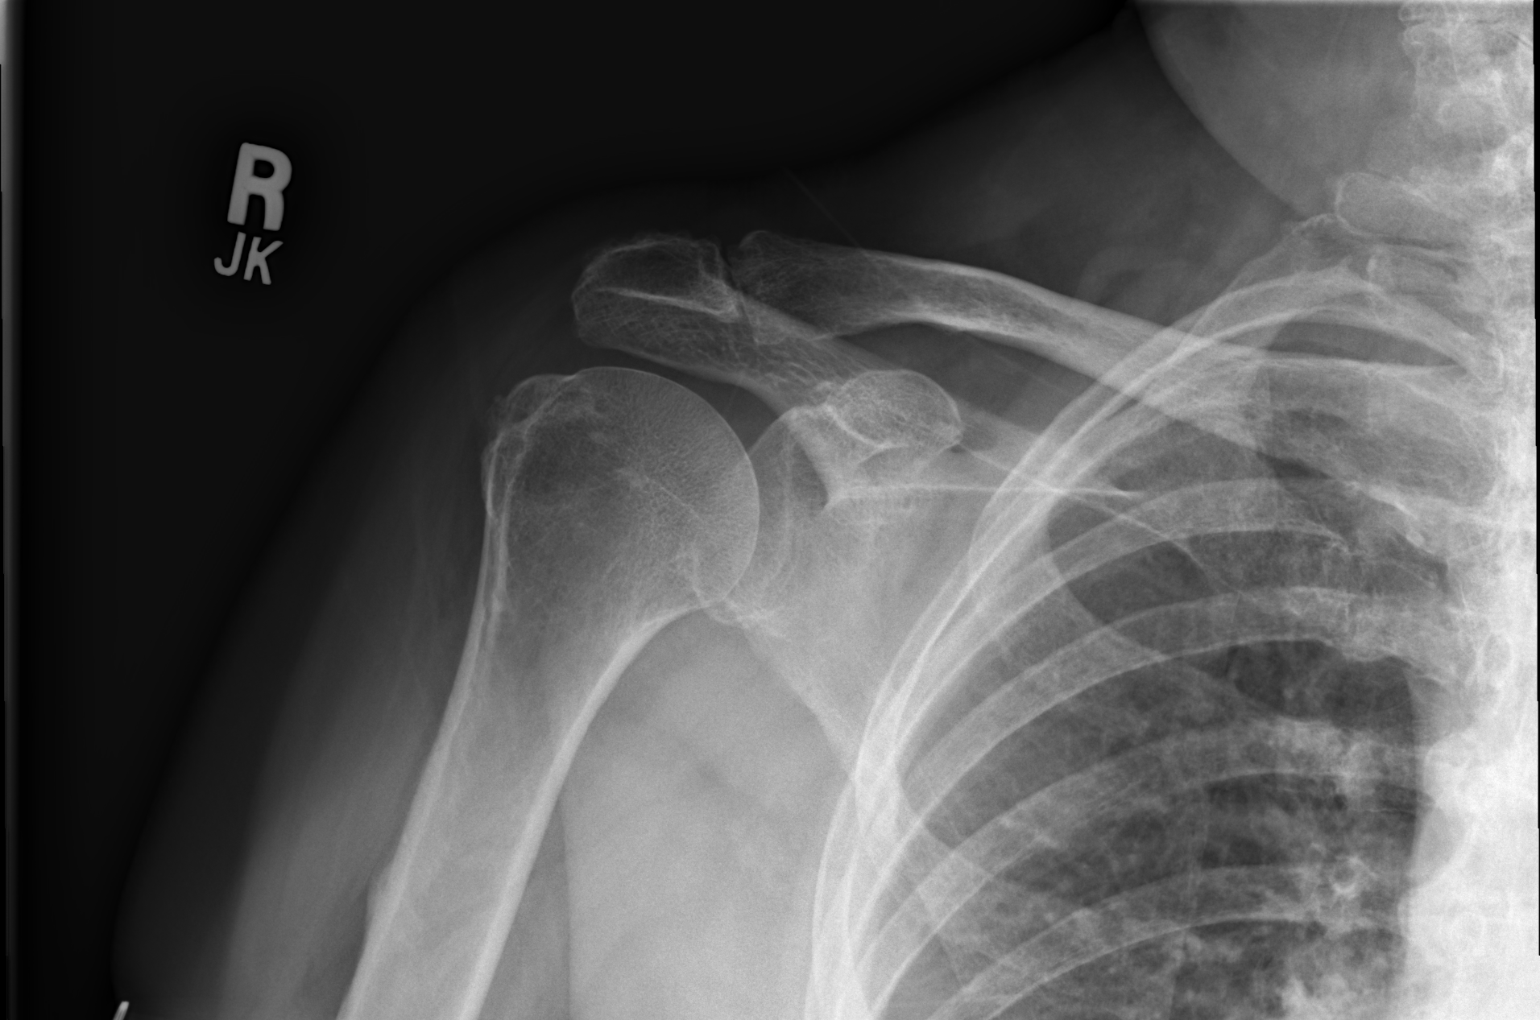

[w shoulder y-view right]
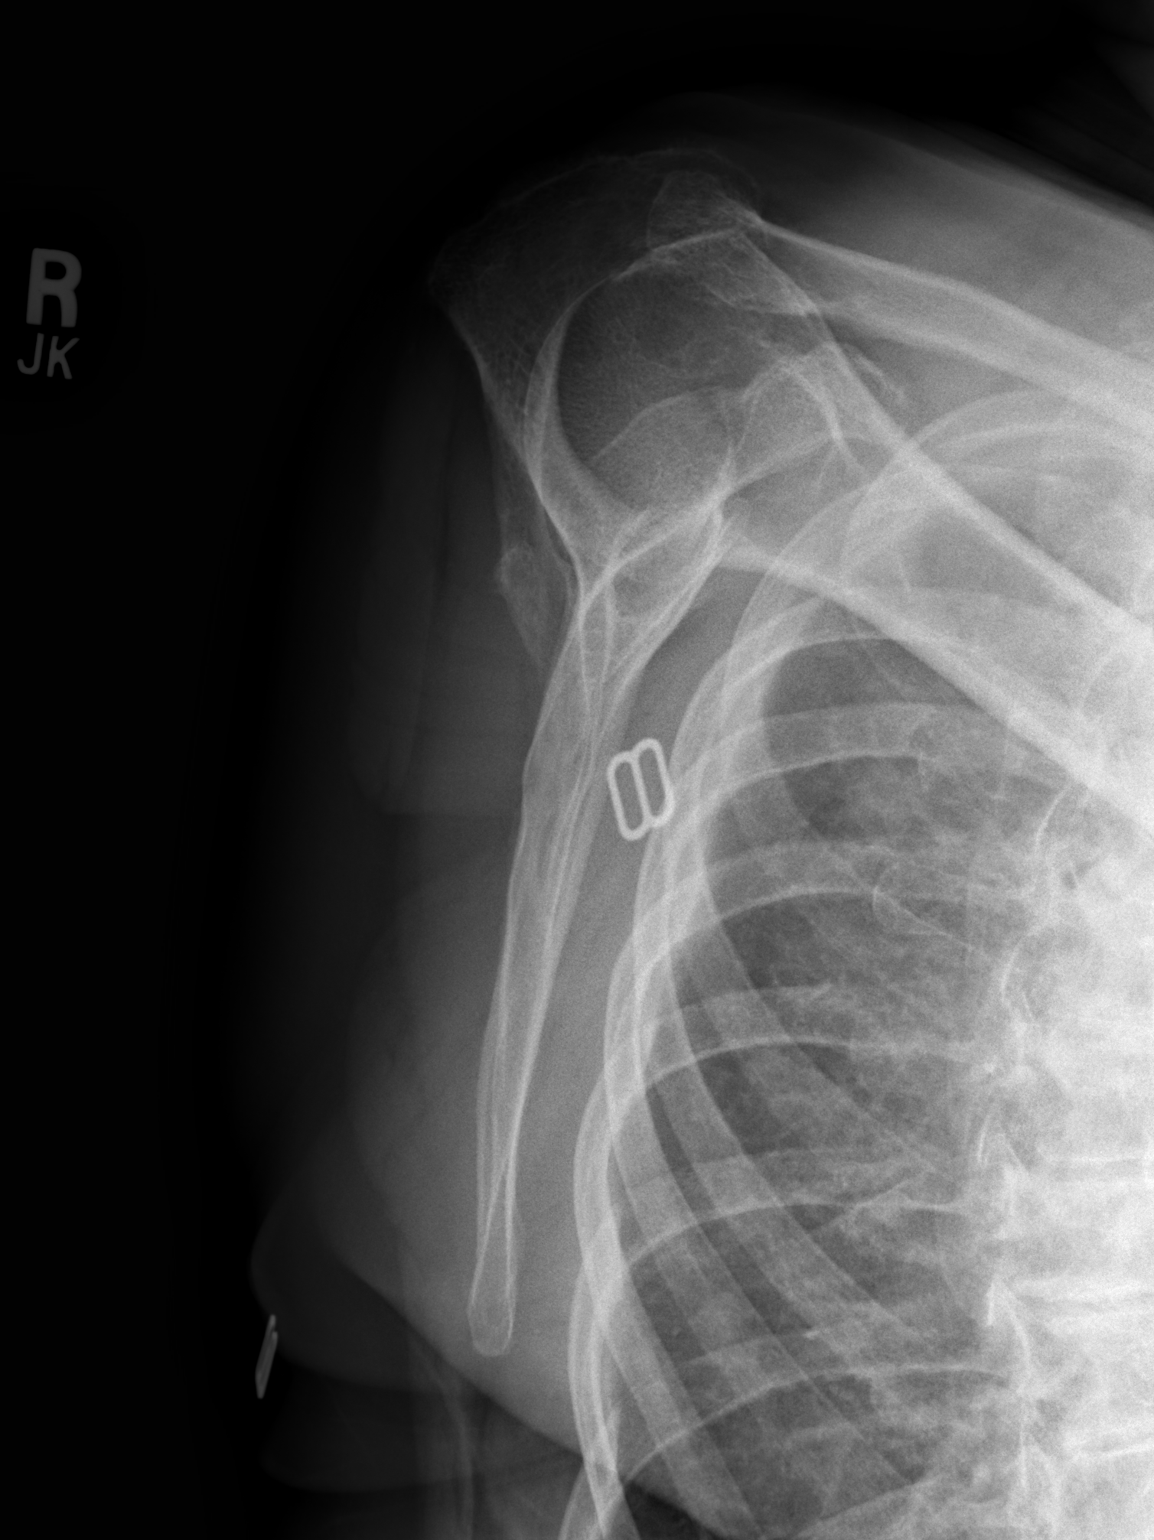

[2 of 2 positions shown; findings below may reference images not displayed]

FINDINGS: There is no evidence of fracture or dislocation. Moderate
degenerative changes seen involving the right acromioclavicular
joint. Soft tissues are unremarkable.
IMPRESSION: Moderate degenerative joint disease of the right acromioclavicular
joint. No acute abnormality seen in the right shoulder.

## 2019-09-16 DIAGNOSIS — M2550 Pain in unspecified joint: Secondary | ICD-10-CM | POA: Diagnosis not present

## 2019-09-16 DIAGNOSIS — G894 Chronic pain syndrome: Secondary | ICD-10-CM | POA: Diagnosis not present

## 2019-09-16 DIAGNOSIS — M19011 Primary osteoarthritis, right shoulder: Secondary | ICD-10-CM | POA: Diagnosis not present

## 2019-09-16 DIAGNOSIS — M170 Bilateral primary osteoarthritis of knee: Secondary | ICD-10-CM | POA: Diagnosis not present

## 2019-10-06 DIAGNOSIS — M47812 Spondylosis without myelopathy or radiculopathy, cervical region: Secondary | ICD-10-CM | POA: Diagnosis not present

## 2019-10-30 IMAGING — CR DG HAND COMPLETE 3+V*L*
3 series · 3 of 3 positions shown · non-contrast
Comparison: None.

CLINICAL DATA: Chronic left hand pain.

EXAM:
LEFT HAND - COMPLETE 3+ VIEW

[x hand pa left]
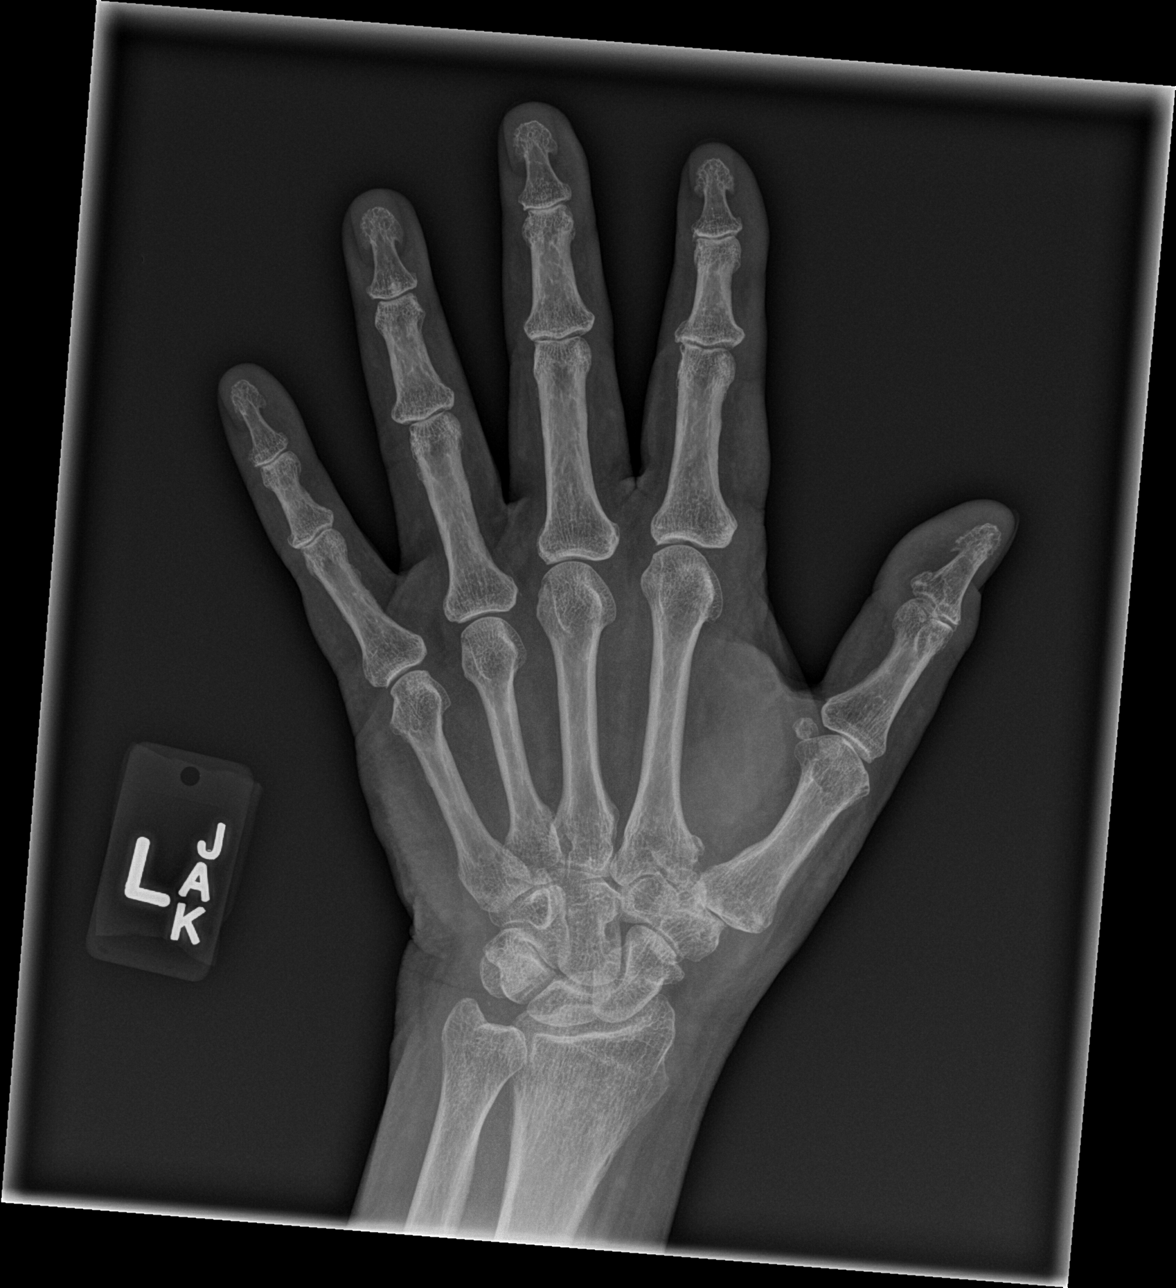

[x hand obl left]
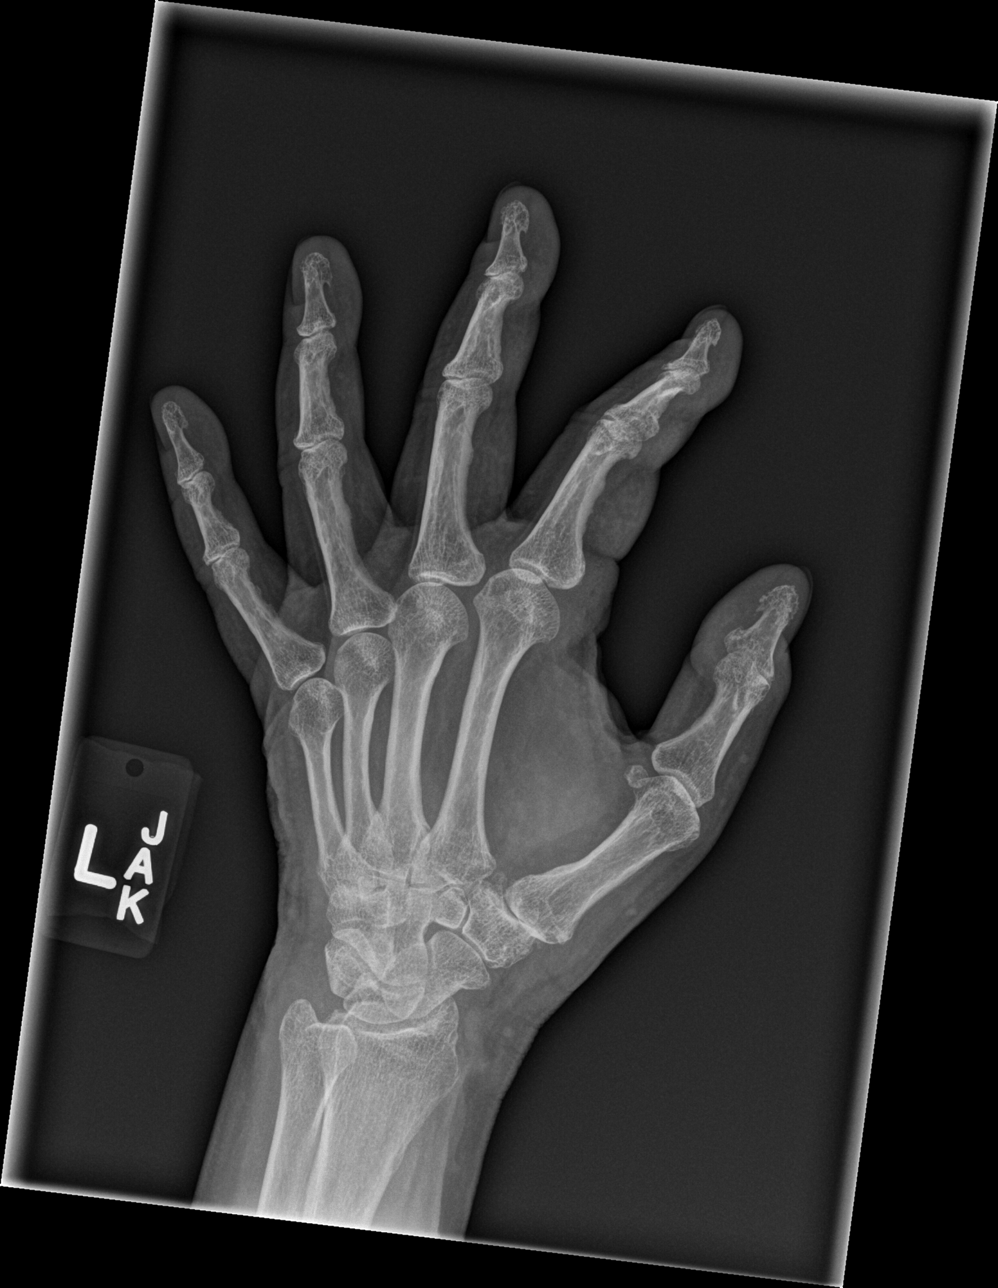

[x hand lat left]
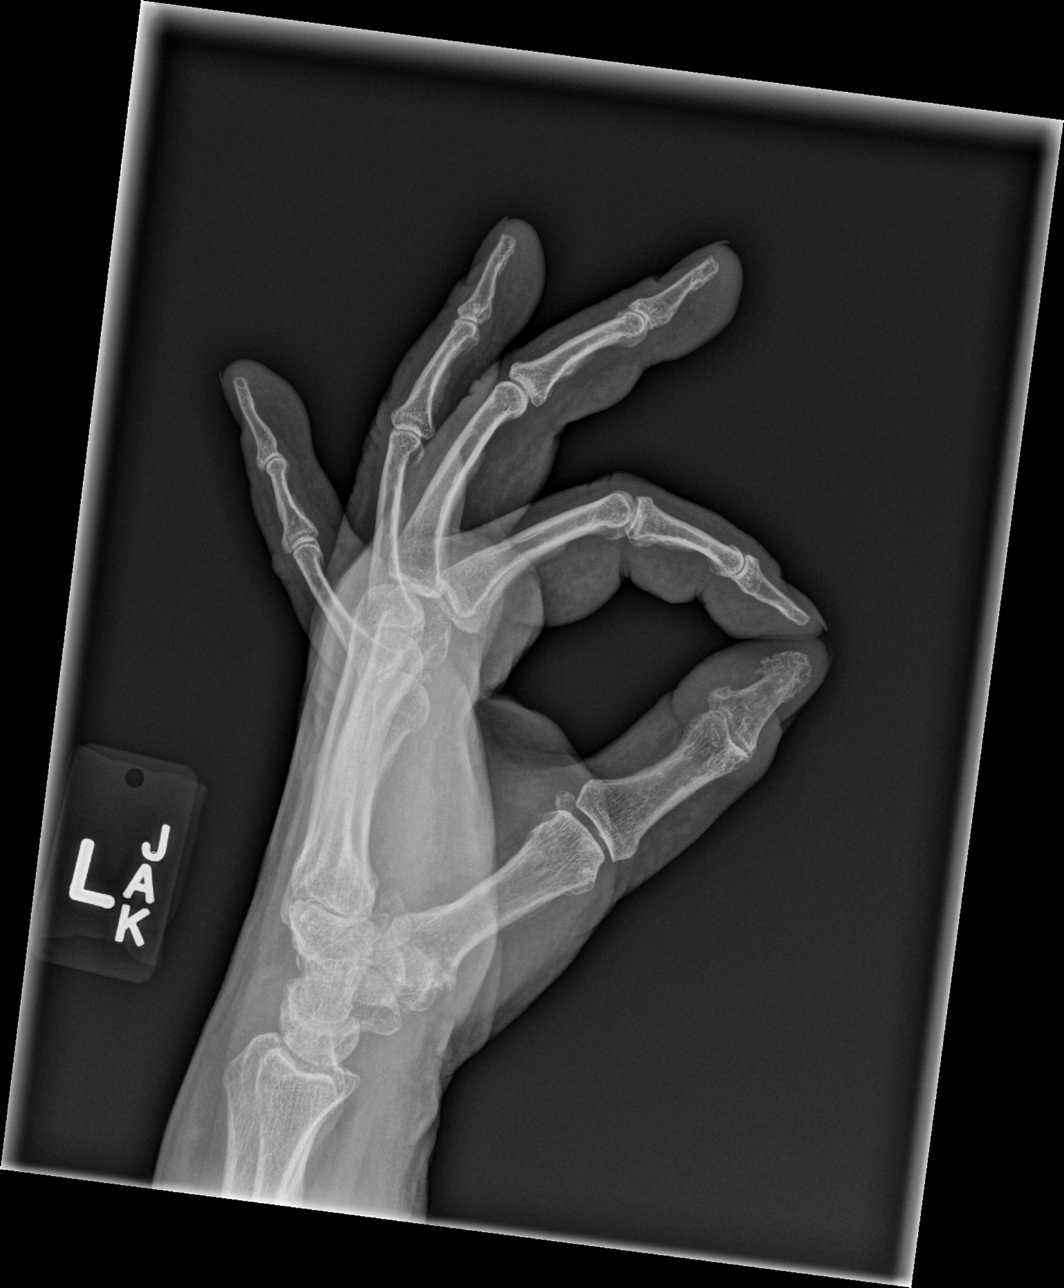

[3 of 3 positions shown; findings below may reference images not displayed]

FINDINGS: There is no evidence of fracture or dislocation. Mild degenerative
changes seen along the carpometacarpal articulation of the left
thumb. Mild narrowing of the PIP and D IP joints is seen. Additional
chronic changes are seen involving the base of the second left
metacarpal. Soft tissues are unremarkable.
IMPRESSION: Mild degenerative changes in the left hand.

## 2019-11-11 DIAGNOSIS — Z79891 Long term (current) use of opiate analgesic: Secondary | ICD-10-CM | POA: Diagnosis not present

## 2019-11-11 DIAGNOSIS — M2550 Pain in unspecified joint: Secondary | ICD-10-CM | POA: Diagnosis not present

## 2019-11-11 DIAGNOSIS — M19011 Primary osteoarthritis, right shoulder: Secondary | ICD-10-CM | POA: Diagnosis not present

## 2019-11-11 DIAGNOSIS — M542 Cervicalgia: Secondary | ICD-10-CM | POA: Diagnosis not present

## 2019-11-11 DIAGNOSIS — Z79899 Other long term (current) drug therapy: Secondary | ICD-10-CM | POA: Diagnosis not present

## 2019-11-11 DIAGNOSIS — G894 Chronic pain syndrome: Secondary | ICD-10-CM | POA: Diagnosis not present

## 2019-12-16 DIAGNOSIS — M19011 Primary osteoarthritis, right shoulder: Secondary | ICD-10-CM | POA: Diagnosis not present

## 2019-12-16 DIAGNOSIS — M2550 Pain in unspecified joint: Secondary | ICD-10-CM | POA: Diagnosis not present

## 2019-12-16 DIAGNOSIS — G894 Chronic pain syndrome: Secondary | ICD-10-CM | POA: Diagnosis not present

## 2019-12-16 DIAGNOSIS — M542 Cervicalgia: Secondary | ICD-10-CM | POA: Diagnosis not present

## 2020-01-15 DIAGNOSIS — G894 Chronic pain syndrome: Secondary | ICD-10-CM | POA: Diagnosis not present

## 2020-01-15 DIAGNOSIS — M545 Low back pain: Secondary | ICD-10-CM | POA: Diagnosis not present

## 2020-01-15 DIAGNOSIS — M19011 Primary osteoarthritis, right shoulder: Secondary | ICD-10-CM | POA: Diagnosis not present

## 2020-01-15 DIAGNOSIS — M2550 Pain in unspecified joint: Secondary | ICD-10-CM | POA: Diagnosis not present

## 2020-02-05 DIAGNOSIS — Z23 Encounter for immunization: Secondary | ICD-10-CM | POA: Diagnosis not present

## 2020-02-12 DIAGNOSIS — Z79899 Other long term (current) drug therapy: Secondary | ICD-10-CM | POA: Diagnosis not present

## 2020-02-12 DIAGNOSIS — M2550 Pain in unspecified joint: Secondary | ICD-10-CM | POA: Diagnosis not present

## 2020-02-12 DIAGNOSIS — G894 Chronic pain syndrome: Secondary | ICD-10-CM | POA: Diagnosis not present

## 2020-02-12 DIAGNOSIS — M170 Bilateral primary osteoarthritis of knee: Secondary | ICD-10-CM | POA: Diagnosis not present

## 2020-02-12 DIAGNOSIS — M19011 Primary osteoarthritis, right shoulder: Secondary | ICD-10-CM | POA: Diagnosis not present

## 2020-02-12 DIAGNOSIS — Z79891 Long term (current) use of opiate analgesic: Secondary | ICD-10-CM | POA: Diagnosis not present

## 2020-02-17 DIAGNOSIS — Z23 Encounter for immunization: Secondary | ICD-10-CM | POA: Diagnosis not present

## 2020-02-27 ENCOUNTER — Ambulatory Visit (INDEPENDENT_AMBULATORY_CARE_PROVIDER_SITE_OTHER): Payer: Medicare Other | Admitting: Physician Assistant

## 2020-02-27 ENCOUNTER — Other Ambulatory Visit: Payer: Self-pay

## 2020-02-27 ENCOUNTER — Encounter: Payer: Self-pay | Admitting: Physician Assistant

## 2020-02-27 VITALS — BP 118/70 | HR 86 | Temp 98.0°F | Ht 64.5 in | Wt 221.0 lb

## 2020-02-27 DIAGNOSIS — I10 Essential (primary) hypertension: Secondary | ICD-10-CM | POA: Diagnosis not present

## 2020-02-27 DIAGNOSIS — E782 Mixed hyperlipidemia: Secondary | ICD-10-CM | POA: Diagnosis not present

## 2020-02-27 DIAGNOSIS — M159 Polyosteoarthritis, unspecified: Secondary | ICD-10-CM | POA: Diagnosis not present

## 2020-02-27 DIAGNOSIS — L304 Erythema intertrigo: Secondary | ICD-10-CM

## 2020-02-27 MED ORDER — FLUCONAZOLE 150 MG PO TABS: 150.0000 mg | ORAL_TABLET | ORAL | 0 refills | Status: DC

## 2020-02-27 MED ORDER — TRIAMCINOLONE ACETONIDE 0.1 % EX CREA: TOPICAL_CREAM | Freq: Two times a day (BID) | CUTANEOUS | 1 refills | Status: DC

## 2020-02-27 MED ORDER — NYSTATIN 100000 UNIT/GM EX OINT: TOPICAL_OINTMENT | CUTANEOUS | 0 refills | Status: DC

## 2020-02-27 NOTE — Addendum Note (Signed)
Addended by: Jimmye Norman on: 02/27/2020 02:34 PM   Modules accepted: Orders

## 2020-02-27 NOTE — Patient Instructions (Addendum)
It was great to see you!  For your rash: Trial topical nystatin ointment twice daily after cleaning and drying area. Trial weekly diflucan pill to see if this can help your symptoms. We will do this weekly x 1 week.  I will refill the triamcinolone ointment, but I recommend avoiding use for now so we can see if these other regimens will help you, it is not currently helping you and can make fungal rashes worse.  When your symptoms improve, let's have you use a preventative ointment -- such as over the counter Desitin or A&D. Apply this to the area daily to prevent recurrence.  If this does not help, will refer you to dermatology.  I will be in touch with your lab results.  Take care,  Jarold Motto PA-C

## 2020-02-27 NOTE — Progress Notes (Signed)
Diana Arellano is a 74 y.o. female is here to establish cream.  I acted as a Neurosurgeon for [employer], PA-C Corky Mull, LPN   History of Present Illness:   Chief Complaint  Patient presents with  . Establish Care  . Fungal infection in groin area    HPI   Pt is here to establish care today.  Arthritis She started having orthopedic issues in the 2nd grade, but treatment was not started until 5th grade. States that her father took her to a major hospital and she ended up in a L leg cast and received mega doses of vitamins. Has had ongoing pain since. Has arthritis in knees and spine. 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 Has had DEXA scan, most recently 5-6 years ago, states that this was normal.  HTN Currently taking Lisinopril 40 mg. At home blood pressure readings are: well controlled. Patient denies chest pain, SOB, blurred vision, dizziness, unusual headaches, lower leg swelling. Patient is compliant with medication. Denies excessive caffeine intake, stimulant usage, excessive alcohol intake, or increase in salt consumption.  BP Readings from Last 3 Encounters:  02/27/20 118/70  01/03/14 160/78  08/18/13 (!) 168/85   HLD Currently on zocor 40 mg daily, Lovaza 1 gram daily, fenofibrate 54 mg daily. She states that she is due for a lipid panel today.  Fungal infection Pt has been dealing with fungal infection in her groin area for the past 4-5 yrs and will not go away. Pt has had other areas affected but has cleared up. Pt is using Triamcinolone Cream. Starts out as redness.    Health Maintenance Due  Topic Date Due  . Hepatitis C Screening  Never done  . COLONOSCOPY  Never done  . DEXA SCAN  Never done    Past Medical History:  Diagnosis Date  . Cataract   . Complication of anesthesia    diff to start stream  . GERD (gastroesophageal reflux disease)    well controlled with medication  . Headache(784.0)    from  cervical radiculopathy  . Heart murmur    takes amoxicillin before dental appointments; was told at one point she had one     Social History   Tobacco Use  . Smoking status: Never Smoker  . Smokeless tobacco: Never Used  Vaping Use  . Vaping Use: Never used  Substance Use Topics  . Alcohol use: Yes    Alcohol/week: 10.0 standard drinks    Types: 10 Shots of liquor per week    Comment: daily  . Drug use: No    Past Surgical History:  Procedure Laterality Date  . ABDOMINAL HYSTERECTOMY    . JOINT REPLACEMENT     bil  knees replaced    Family History  Problem Relation Age of Onset  . Alzheimer's disease Father   . Hypertension Sister   . Kidney disease Sister   . Diabetes Brother   . Heart attack Brother   . Prostate cancer Brother   . Alzheimer's disease Maternal Grandmother   . Alzheimer's disease Paternal Grandmother     PMHx, SurgHx, SocialHx, FamHx, Medications, and Allergies were reviewed in the Visit Navigator and updated as appropriate.   Patient Active Problem List   Diagnosis Date Noted  . Muscle spasms of neck 05/07/2014  . Spinal stenosis in cervical region 08/18/2013  . Benign essential hypertension 02/13/2013  . Chronic tension-type headache 02/13/2013  . DDD (degenerative disc disease), cervical 02/13/2013  .  Gastro-esophageal reflux disease with esophagitis 02/13/2013  . Generalized OA 02/13/2013  . Mixed hyperlipidemia 02/13/2013  . Recurrent depressive disorder, current episode mild (HCC) 02/13/2013  . Sciatica 02/13/2013  . Thoracic or lumbosacral neuritis or radiculitis, unspecified 02/13/2013  . Spondylolisthesis of lumbar region 12/14/2011  . Lumbar spinal stenosis 11/14/2011    Social History   Tobacco Use  . Smoking status: Never Smoker  . Smokeless tobacco: Never Used  Vaping Use  . Vaping Use: Never used  Substance Use Topics  . Alcohol use: Yes    Alcohol/week: 10.0 standard drinks    Types: 10 Shots of liquor per week     Comment: daily  . Drug use: No    Current Medications and Allergies:    Current Outpatient Medications:  .  Cholecalciferol (VITAMIN D-3) 25 MCG (1000 UT) CAPS, Take 1 capsule by mouth daily., Disp: , Rfl:  .  folic acid (FOLVITE) 400 MCG tablet, Take 400 mcg by mouth daily., Disp: , Rfl:  .  aspirin EC 81 MG tablet, Take 243 mg by mouth every morning. , Disp: , Rfl:  .  buprenorphine (BUTRANS) 20 MCG/HR PTWK, 1 patch once a week., Disp: , Rfl:  .  DULoxetine (CYMBALTA) 30 MG capsule, Take 30 mg by mouth every 12 (twelve) hours., Disp: , Rfl:  .  fenofibrate 54 MG tablet, TAKE 1 TABLET BY MOUTH EVERY DAY WITH A MEAL, Disp: , Rfl:  .  fluconazole (DIFLUCAN) 150 MG tablet, Take 1 tablet (150 mg total) by mouth once a week., Disp: 4 tablet, Rfl: 0 .  lisinopril (PRINIVIL,ZESTRIL) 40 MG tablet, Take 40 mg by mouth at bedtime., Disp: , Rfl:  .  methocarbamol (ROBAXIN) 500 MG tablet, Take 500 mg by mouth 4 (four) times daily., Disp: , Rfl:  .  MOVANTIK 25 MG TABS tablet, Take 25 mg by mouth daily., Disp: , Rfl:  .  Multiple Vitamin (MULTIVITAMIN WITH MINERALS) TABS, Take 1 tablet by mouth at bedtime. , Disp: , Rfl:  .  naproxen sodium (ANAPROX) 220 MG tablet, Take 660 mg by mouth 2 (two) times daily as needed (pain)., Disp: , Rfl:  .  nystatin ointment (MYCOSTATIN), Apply to affected area 1-2 times daily, Disp: 30 g, Rfl: 0 .  omega-3 acid ethyl esters (LOVAZA) 1 G capsule, Take 1 g by mouth daily., Disp: , Rfl:  .  omeprazole (PRILOSEC) 40 MG capsule, Take 40 mg by mouth every evening., Disp: , Rfl:  .  phenazopyridine (PYRIDIUM) 100 MG tablet, Take 1 tablet (100 mg total) by mouth 3 (three) times daily with meals., Disp: 10 tablet, Rfl: 0 .  simvastatin (ZOCOR) 40 MG tablet, Take 400 mg by mouth at bedtime. , Disp: , Rfl:  .  triamcinolone cream (KENALOG) 0.1 %, Apply topically 2 (two) times daily., Disp: 30 g, Rfl: 1   Allergies  Allergen Reactions  . Neosporin [Neomycin-Bacitracin  Zn-Polymyx] Itching    inflammation  . Sulfa Antibiotics Itching    inflammation    Review of Systems   ROS  Negative unless otherwise specified per HPI.  Vitals:   Vitals:   02/27/20 1046  BP: 118/70  Pulse: 86  Temp: 98 F (36.7 C)  TempSrc: Temporal  SpO2: 97%  Weight: 221 lb (100.2 kg)  Height: 5' 4.5" (1.638 m)     Body mass index is 37.35 kg/m.   Physical Exam:    Physical Exam Vitals and nursing note reviewed.  Constitutional:      General:  She is not in acute distress.    Appearance: She is well-developed. She is not ill-appearing or toxic-appearing.  Cardiovascular:     Rate and Rhythm: Normal rate and regular rhythm.     Pulses: Normal pulses.     Heart sounds: Normal heart sounds, S1 normal and S2 normal.     Comments: No LE edema Pulmonary:     Effort: Pulmonary effort is normal.     Breath sounds: Normal breath sounds.  Skin:    General: Skin is warm and dry.     Comments: Patient refused evaluation of rash  Neurological:     Mental Status: She is alert.     GCS: GCS eye subscore is 4. GCS verbal subscore is 5. GCS motor subscore is 6.  Psychiatric:        Speech: Speech normal.        Behavior: Behavior normal. Behavior is cooperative.      Assessment and Plan:    Diana Arellano was seen today for establish care and fungal infection in groin area.  Diagnoses and all orders for this visit:  Generalized OA Currently seeing pain management for this. Denies acute concerns.  Benign essential hypertension Currently well controlled with Lisinopril 40 mg daily. Follow-up in 6 months, sooner if concerns. -     CBC with Differential/Platelet; Future -     Comprehensive metabolic panel; Future -     Comprehensive metabolic panel -     CBC with Differential/Platelet  Mixed hyperlipidemia Update lipid panel today. Will provide recommendations on her lovaza, zocor and fenofibrate as indicated. -     Lipid panel; Future -     Lipid  panel  Intertrigo She declined examination. Will trial: weekly fluconazole x 4 weeks. Hold triamcinolone (however did provide refill per patient request.) Trial topical nystatin. Keep area clean and dry. Once improved, use daily zinc oxide barrier. Referral to dermatology if lack of improvement or worsening.  Other orders -     fluconazole (DIFLUCAN) 150 MG tablet; Take 1 tablet (150 mg total) by mouth once a week. -     nystatin ointment (MYCOSTATIN); Apply to affected area 1-2 times daily -     triamcinolone cream (KENALOG) 0.1 %; Apply topically 2 (two) times daily.   CMA or LPN served as scribe during this visit. History, Physical, and Plan performed by medical provider. The above documentation has been reviewed and is accurate and complete.  Time spent with patient today was 45 minutes which consisted of chart review, discussing diagnosis, work up, treatment answering questions and documentation.  Jarold Motto, PA-C [place] [place] 02/27/2020  Follow-up: No follow-ups on file.

## 2020-02-28 LAB — CBC WITH DIFFERENTIAL/PLATELET
Absolute Monocytes: 469 cells/uL (ref 200–950)
Basophils Absolute: 57 cells/uL (ref 0–200)
Basophils Relative: 0.8 %
Eosinophils Absolute: 149 cells/uL (ref 15–500)
Eosinophils Relative: 2.1 %
HCT: 43.5 % (ref 35.0–45.0)
Hemoglobin: 14.5 g/dL (ref 11.7–15.5)
Lymphs Abs: 2045 cells/uL (ref 850–3900)
MCH: 30 pg (ref 27.0–33.0)
MCHC: 33.3 g/dL (ref 32.0–36.0)
MCV: 89.9 fL (ref 80.0–100.0)
MPV: 10 fL (ref 7.5–12.5)
Monocytes Relative: 6.6 %
Neutro Abs: 4381 cells/uL (ref 1500–7800)
Neutrophils Relative %: 61.7 %
Platelets: 261 10*3/uL (ref 140–400)
RBC: 4.84 10*6/uL (ref 3.80–5.10)
RDW: 13.1 % (ref 11.0–15.0)
Total Lymphocyte: 28.8 %
WBC: 7.1 10*3/uL (ref 3.8–10.8)

## 2020-02-28 LAB — COMPREHENSIVE METABOLIC PANEL
AG Ratio: 1.9 (calc) (ref 1.0–2.5)
ALT: 27 U/L (ref 6–29)
AST: 22 U/L (ref 10–35)
Albumin: 4.6 g/dL (ref 3.6–5.1)
Alkaline phosphatase (APISO): 106 U/L (ref 37–153)
BUN: 16 mg/dL (ref 7–25)
CO2: 27 mmol/L (ref 20–32)
Calcium: 10.2 mg/dL (ref 8.6–10.4)
Chloride: 104 mmol/L (ref 98–110)
Creat: 0.73 mg/dL (ref 0.60–0.93)
Globulin: 2.4 g/dL (calc) (ref 1.9–3.7)
Glucose, Bld: 96 mg/dL (ref 65–99)
Potassium: 4.2 mmol/L (ref 3.5–5.3)
Sodium: 142 mmol/L (ref 135–146)
Total Bilirubin: 0.4 mg/dL (ref 0.2–1.2)
Total Protein: 7 g/dL (ref 6.1–8.1)

## 2020-02-28 LAB — LIPID PANEL
Cholesterol: 190 mg/dL (ref <200)
HDL: 50 mg/dL (ref >=50)
LDL Cholesterol (Calc): 101 mg/dL (calc) — ABNORMAL HIGH
Non-HDL Cholesterol (Calc): 140 mg/dL (calc) — ABNORMAL HIGH (ref <130)
Total CHOL/HDL Ratio: 3.8 (calc) (ref <5.0)
Triglycerides: 271 mg/dL — ABNORMAL HIGH (ref <150)

## 2020-03-01 DIAGNOSIS — M47812 Spondylosis without myelopathy or radiculopathy, cervical region: Secondary | ICD-10-CM | POA: Diagnosis not present

## 2020-03-04 ENCOUNTER — Encounter: Payer: Self-pay | Admitting: *Deleted

## 2020-03-08 ENCOUNTER — Telehealth: Payer: Self-pay

## 2020-03-08 MED ORDER — SIMVASTATIN 40 MG PO TABS: 40.0000 mg | ORAL_TABLET | Freq: Every day | ORAL | 2 refills | Status: DC

## 2020-03-08 MED ORDER — FENOFIBRATE 54 MG PO TABS: ORAL_TABLET | ORAL | 2 refills | Status: DC

## 2020-03-08 MED ORDER — LISINOPRIL 40 MG PO TABS: 40.0000 mg | ORAL_TABLET | Freq: Every day | ORAL | 2 refills | Status: DC

## 2020-03-08 NOTE — Telephone Encounter (Signed)
Pt notified Rx's sent to pharmacy as requested. 

## 2020-03-08 NOTE — Telephone Encounter (Signed)
MEDICATION: lisinopril, fenofibrate, simvastatin  PHARMACY:  [facility] [address] [place]  Comments: Pt asked for nurse to give her a call once medications are sent in  **Let patient know to contact pharmacy at the end of the day to make sure medication is ready. **  ** Please notify patient to allow 48-72 hours to process**  **Encourage patient to contact the pharmacy for refills or they can request refills through [facility]**

## 2020-03-15 DIAGNOSIS — R519 Headache, unspecified: Secondary | ICD-10-CM | POA: Diagnosis not present

## 2020-03-15 DIAGNOSIS — M47812 Spondylosis without myelopathy or radiculopathy, cervical region: Secondary | ICD-10-CM | POA: Diagnosis not present

## 2020-03-15 DIAGNOSIS — M542 Cervicalgia: Secondary | ICD-10-CM | POA: Diagnosis not present

## 2020-03-15 DIAGNOSIS — M19011 Primary osteoarthritis, right shoulder: Secondary | ICD-10-CM | POA: Diagnosis not present

## 2020-04-12 DIAGNOSIS — M47812 Spondylosis without myelopathy or radiculopathy, cervical region: Secondary | ICD-10-CM | POA: Diagnosis not present

## 2020-04-12 DIAGNOSIS — R519 Headache, unspecified: Secondary | ICD-10-CM | POA: Diagnosis not present

## 2020-04-12 DIAGNOSIS — M19011 Primary osteoarthritis, right shoulder: Secondary | ICD-10-CM | POA: Diagnosis not present

## 2020-04-12 DIAGNOSIS — M542 Cervicalgia: Secondary | ICD-10-CM | POA: Diagnosis not present

## 2020-04-13 ENCOUNTER — Other Ambulatory Visit: Payer: Self-pay | Admitting: Pain Medicine

## 2020-04-13 DIAGNOSIS — G8929 Other chronic pain: Secondary | ICD-10-CM

## 2020-04-21 DIAGNOSIS — M180 Bilateral primary osteoarthritis of first carpometacarpal joints: Secondary | ICD-10-CM | POA: Diagnosis not present

## 2020-04-21 DIAGNOSIS — M1811 Unilateral primary osteoarthritis of first carpometacarpal joint, right hand: Secondary | ICD-10-CM | POA: Diagnosis not present

## 2020-04-21 DIAGNOSIS — M79645 Pain in left finger(s): Secondary | ICD-10-CM | POA: Diagnosis not present

## 2020-04-21 DIAGNOSIS — G8929 Other chronic pain: Secondary | ICD-10-CM | POA: Diagnosis not present

## 2020-04-21 DIAGNOSIS — M1812 Unilateral primary osteoarthritis of first carpometacarpal joint, left hand: Secondary | ICD-10-CM | POA: Diagnosis not present

## 2020-05-02 ENCOUNTER — Encounter: Payer: Self-pay | Admitting: Physician Assistant

## 2020-05-03 ENCOUNTER — Encounter: Payer: Self-pay | Admitting: Physician Assistant

## 2020-05-05 ENCOUNTER — Ambulatory Visit
Admission: RE | Admit: 2020-05-05 | Discharge: 2020-05-05 | Disposition: A | Discharge status: Home / Self Care | Payer: Medicare Other | Source: Ambulatory Visit | Attending: Pain Medicine | Admitting: Pain Medicine

## 2020-05-05 DIAGNOSIS — G8929 Other chronic pain: Secondary | ICD-10-CM

## 2020-05-05 IMAGING — MR MR SHOULDER*R* W/O CM
4 of 6 series · 21 of 40 positions shown · non-contrast
Comparison: X-ray [DATE]

CLINICAL DATA: Chronic right shoulder pain

EXAM:
MRI OF THE RIGHT SHOULDER WITHOUT CONTRAST
TECHNIQUE: Multiplanar, multisequence MR imaging of the shoulder was performed.
No intravenous contrast was administered.

[Series 6: T2 fat-sat · axial · right · 3.0mm · 0.47mm/px · z∈[-27,+71]mm · 8 of 27 slices shown (1 of 3)]
[im 1/27]
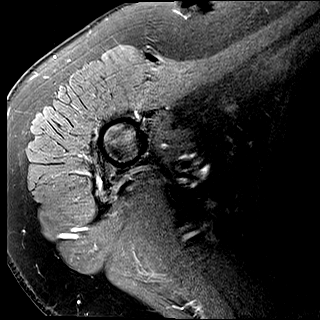
[im 4/27]
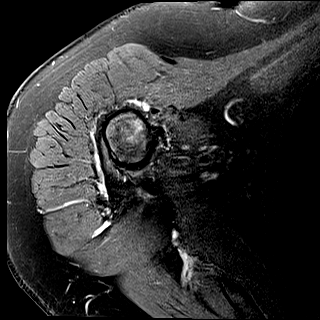
[im 8/27]
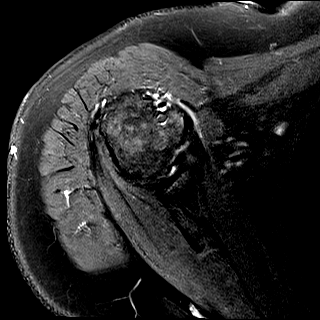
[im 12/27]
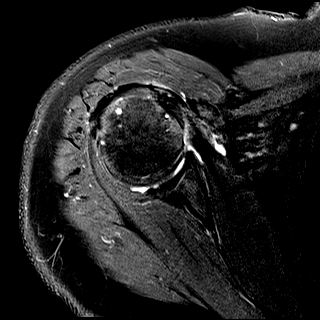
[im 15/27]
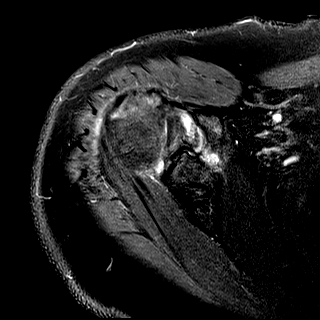
[im 19/27]
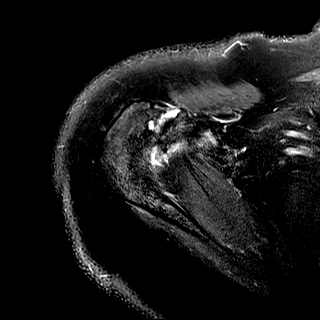
[im 23/27]
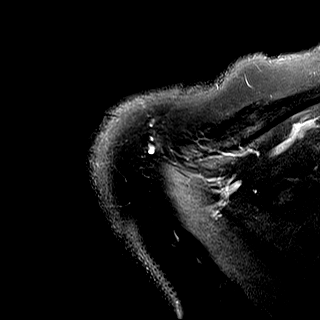
[im 27/27]
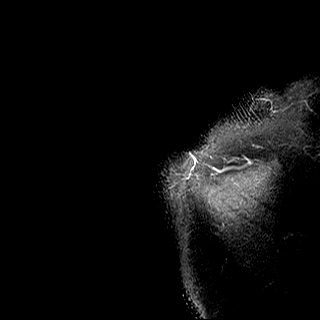

[Series 7: T2 fat-sat · oblique · right · 4.0mm · 0.22mm/px · 4 of 21 slices shown (2 of 3)]
[im 1/21]
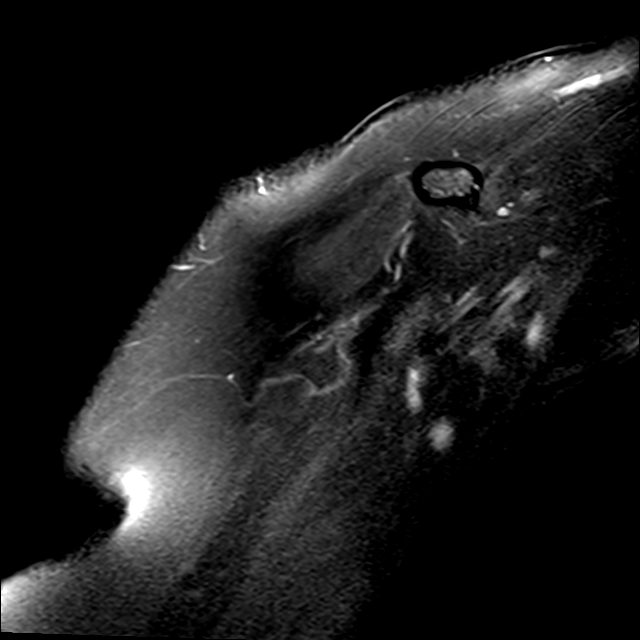
[im 5/21]
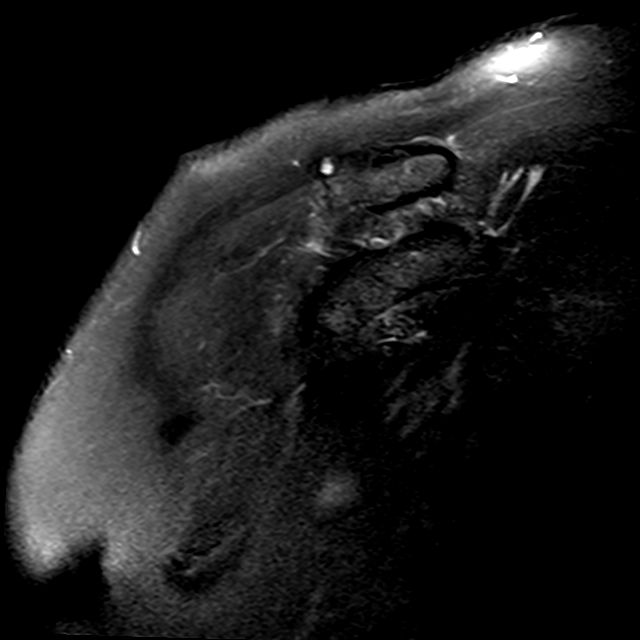
[im 13/21]
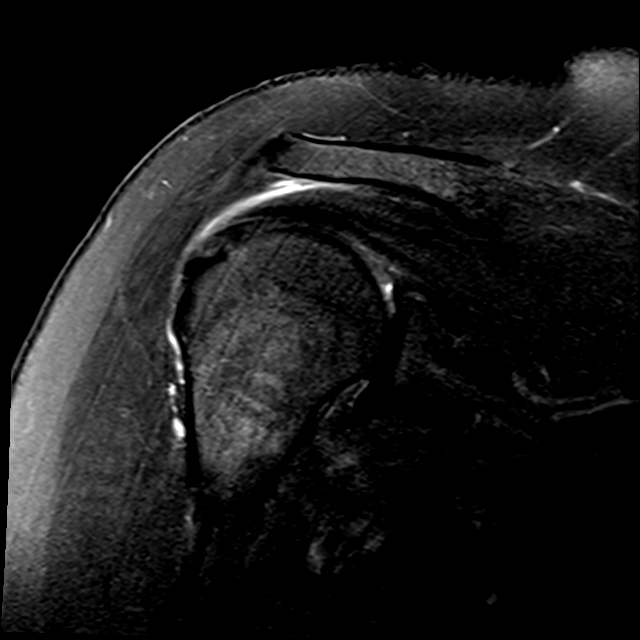
[im 21/21]
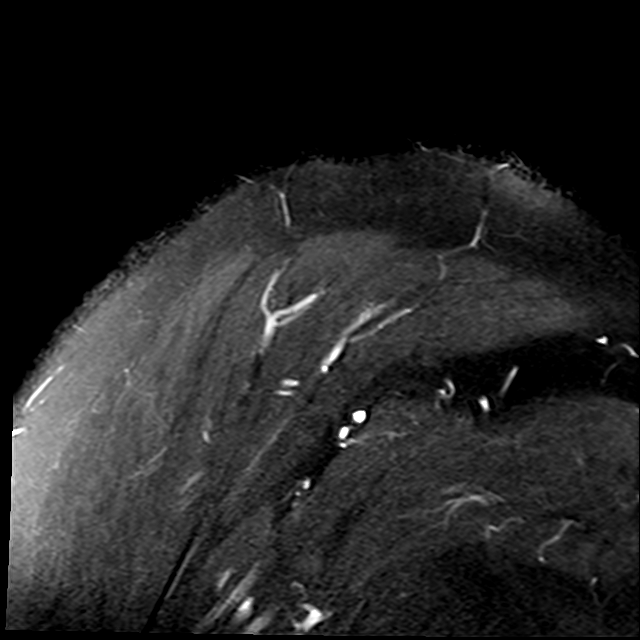

[Series 8: PD · oblique · right · 4.0mm · 0.22mm/px · 6 of 21 slices shown]
[im 1/21]
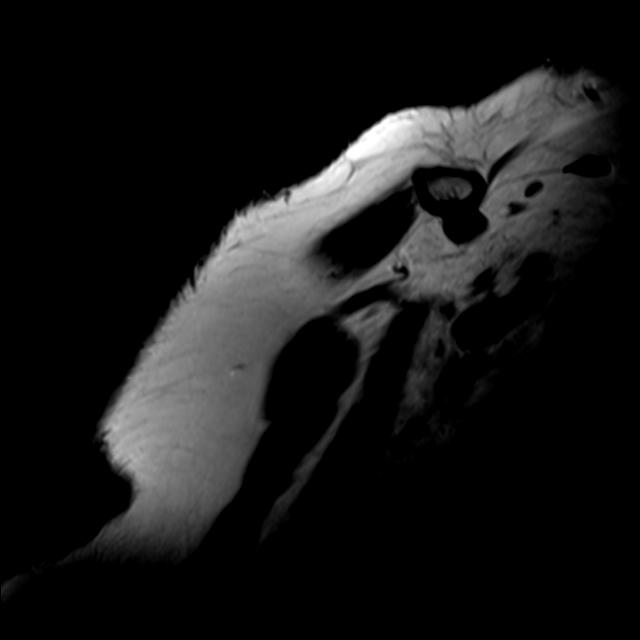
[im 5/21]
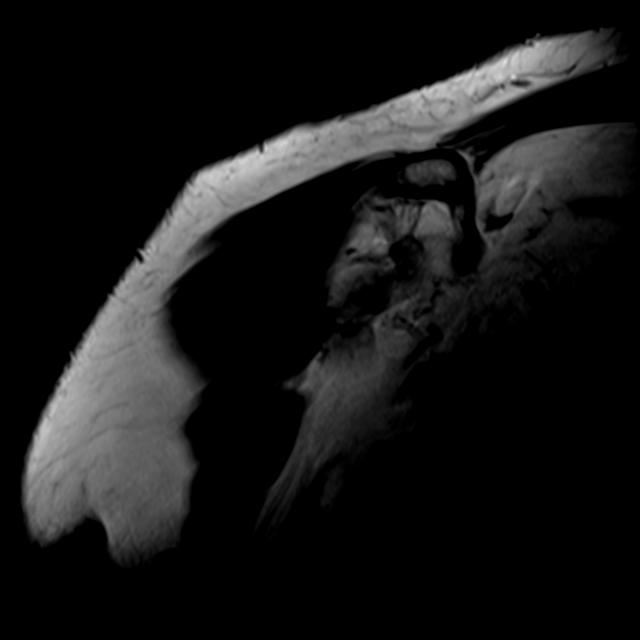
[im 9/21]
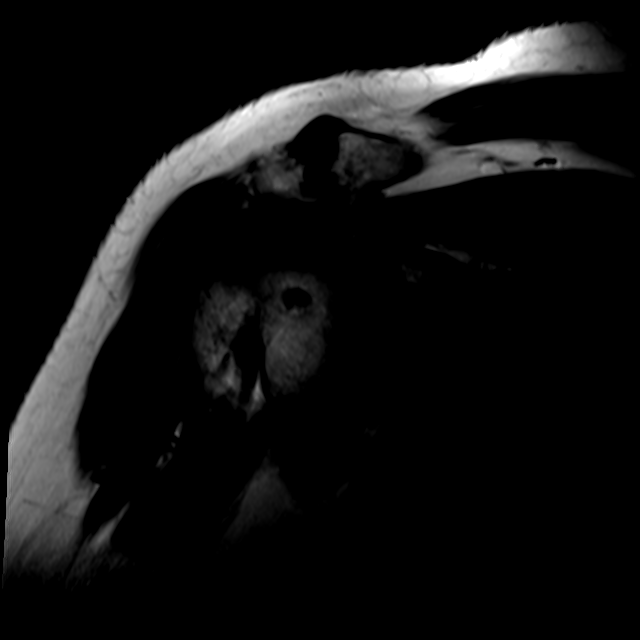
[im 13/21]
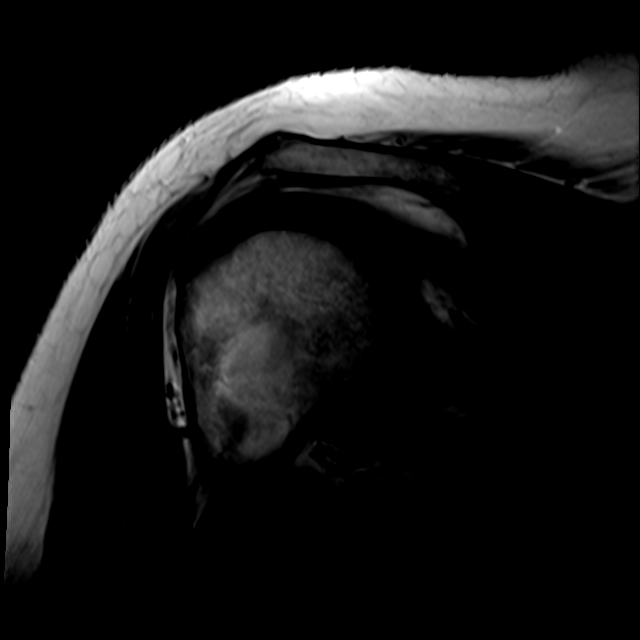
[im 17/21]
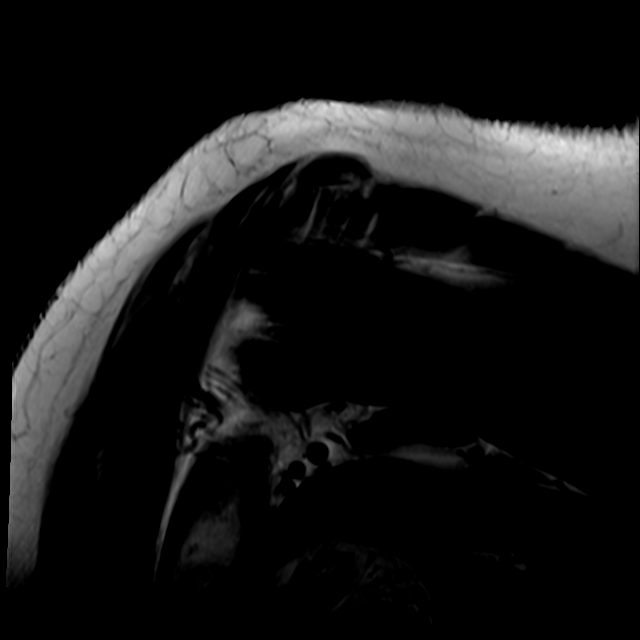
[im 21/21]
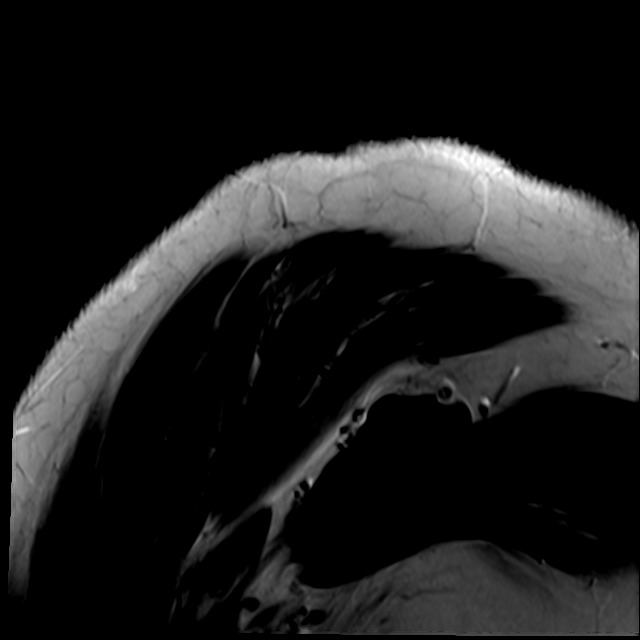

[Series 9: T2 fat-sat · oblique · right · 4.0mm · 0.44mm/px · 3 of 23 slices shown (3 of 3)]
[im 5/23]
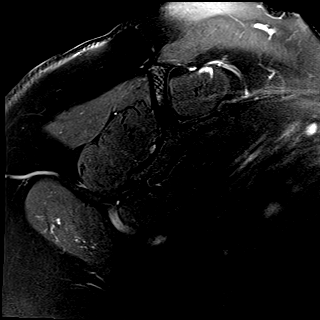
[im 14/23]
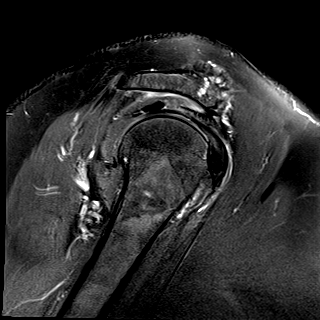
[im 23/23]
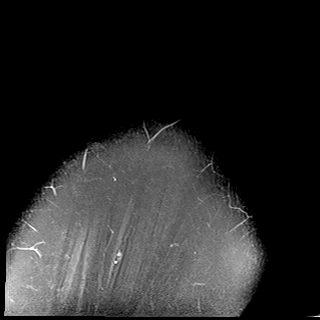

[21 of 40 positions shown; findings below may reference images not displayed]

FINDINGS: Rotator cuff: Moderate tendinosis of the supraspinatus,
infraspinatus, and subscapularis tendons without tear. Intact teres
minor.

Muscles: Preserved bulk and signal intensity of the rotator cuff
musculature without edema, atrophy, or fatty infiltration.

Biceps long head:  Marked intra-articular biceps tendinosis.

Acromioclavicular Joint: Moderate arthropathy of the AC joint. Trace
subacromial-subdeltoid bursal fluid.

Glenohumeral Joint: Mild chondral thinning. No focal defect. No
joint effusion.

Labrum: Grossly intact, but evaluation is limited by lack of
intraarticular fluid.

Bones: Tiny subcortical cystic change at the greater tuberosity. No
acute fracture. No dislocation. No bone marrow edema. No suspicious
bone lesion.

Other: Borderline thickening of the inferior glenohumeral ligament.
Loss of the anatomic fat within the rotator interval.
IMPRESSION: 1. Moderate rotator cuff tendinosis without tear.
2. Marked intra-articular biceps tendinosis.
3. Moderate acromioclavicular and mild glenohumeral osteoarthritis.
4. Findings which can be seen in the clinical setting of adhesive
capsulitis.

## 2020-06-11 ENCOUNTER — Encounter: Payer: Self-pay | Admitting: Physician Assistant

## 2020-06-11 ENCOUNTER — Other Ambulatory Visit: Payer: Self-pay | Admitting: Physician Assistant

## 2020-06-11 DIAGNOSIS — R21 Rash and other nonspecific skin eruption: Secondary | ICD-10-CM

## 2020-06-14 NOTE — Telephone Encounter (Signed)
Please advise if all refills are okay and okay to send Derm referral?

## 2020-06-15 MED ORDER — LISINOPRIL 40 MG PO TABS: 40.0000 mg | ORAL_TABLET | Freq: Every day | ORAL | 1 refills | Status: DC

## 2020-06-15 MED ORDER — TRIAMCINOLONE ACETONIDE 0.1 % EX CREA: TOPICAL_CREAM | Freq: Two times a day (BID) | CUTANEOUS | 2 refills | Status: DC

## 2020-06-15 MED ORDER — FENOFIBRATE 54 MG PO TABS: ORAL_TABLET | ORAL | 1 refills | Status: DC

## 2020-06-15 MED ORDER — OMEPRAZOLE 40 MG PO CPDR: 40.0000 mg | DELAYED_RELEASE_CAPSULE | Freq: Every evening | ORAL | 1 refills | Status: DC

## 2020-06-15 MED ORDER — FLUCONAZOLE 150 MG PO TABS: 150.0000 mg | ORAL_TABLET | ORAL | 0 refills | Status: DC

## 2020-06-15 MED ORDER — NYSTATIN 100000 UNIT/GM EX OINT: TOPICAL_OINTMENT | CUTANEOUS | 0 refills | Status: DC

## 2020-07-07 ENCOUNTER — Telehealth: Payer: Self-pay | Admitting: Physician Assistant

## 2020-07-07 NOTE — Telephone Encounter (Signed)
Patient refuses for AWV

## 2020-07-07 NOTE — Telephone Encounter (Signed)
Left message for patient to call back and schedule Medicare Annual Wellness Visit (AWV) either virtually or in office. No detailed message left    Last AWV no information  please schedule at anytime with    This should be a 45 minute visit.

## 2020-09-10 ENCOUNTER — Other Ambulatory Visit: Payer: Self-pay | Admitting: Physician Assistant

## 2020-11-03 ENCOUNTER — Other Ambulatory Visit: Payer: Self-pay | Admitting: Physician Assistant

## 2020-11-03 DIAGNOSIS — M5416 Radiculopathy, lumbar region: Secondary | ICD-10-CM

## 2020-11-08 ENCOUNTER — Other Ambulatory Visit: Payer: Self-pay | Admitting: Family Medicine

## 2020-11-08 ENCOUNTER — Other Ambulatory Visit: Payer: Self-pay | Admitting: Physician Assistant

## 2020-11-16 ENCOUNTER — Ambulatory Visit
Admission: RE | Admit: 2020-11-16 | Discharge: 2020-11-16 | Disposition: A | Discharge status: Home / Self Care | Payer: Medicare Other | Source: Ambulatory Visit | Attending: Physician Assistant | Admitting: Physician Assistant

## 2020-11-16 ENCOUNTER — Other Ambulatory Visit: Payer: Self-pay

## 2020-11-16 DIAGNOSIS — M5416 Radiculopathy, lumbar region: Secondary | ICD-10-CM

## 2020-11-16 IMAGING — MR MR LUMBAR SPINE W/O CM
4 of 5 series · 26 of 48 positions shown · non-contrast
Comparison: Plain films lumbar spine [DATE].

CLINICAL DATA: Low back pain radiating to the left hip and leg with
weakness since [DATE]. History of prior lumbar surgery.

EXAM:
MRI LUMBAR SPINE WITHOUT CONTRAST
TECHNIQUE: Multiplanar, multisequence MR imaging of the lumbar spine was
performed. No intravenous contrast was administered.

[Series 12: T2 · sagittal · 4.0mm · 0.53mm/px · 8 of 16 slices shown (1 of 2)]
[im 1/16]
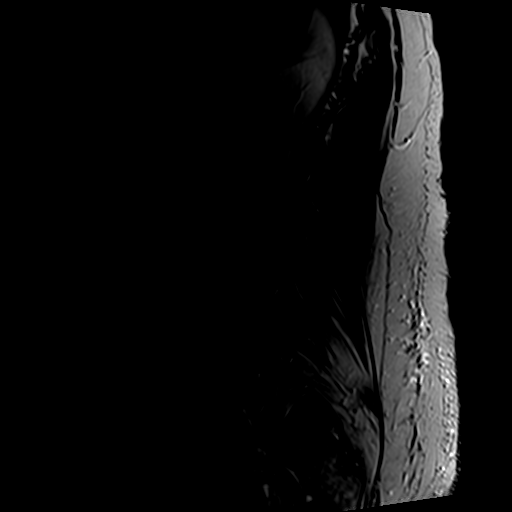
[im 3/16]
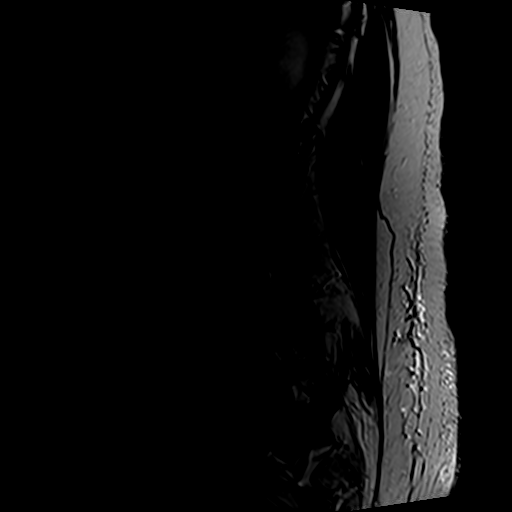
[im 5/16]
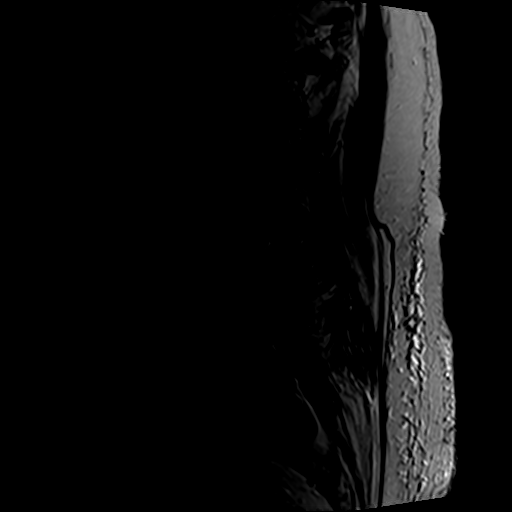
[im 7/16]
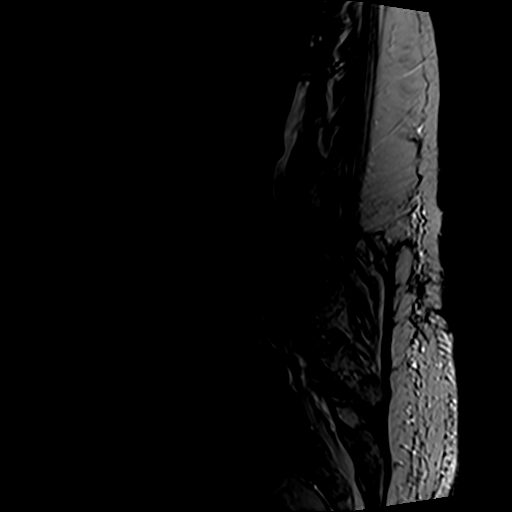
[im 9/16]
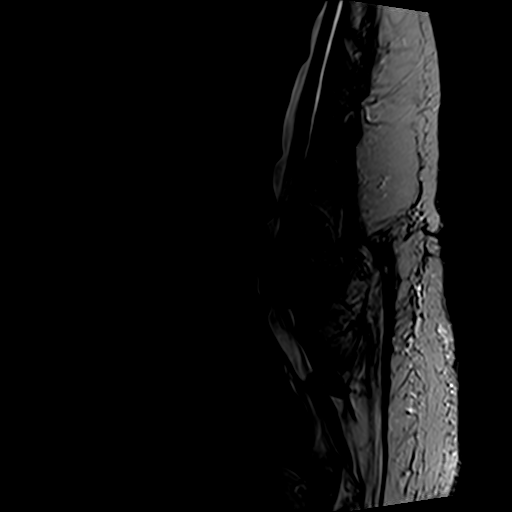
[im 11/16]
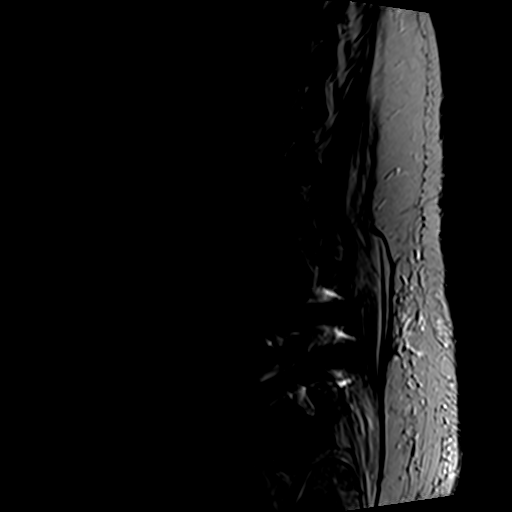
[im 13/16]
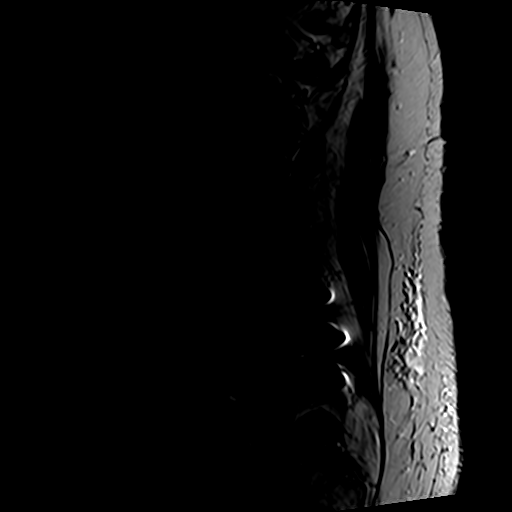
[im 16/16]
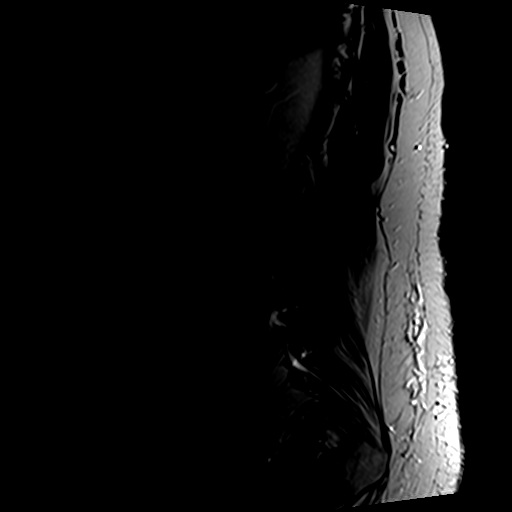

[Series 14: T1 · sagittal · 4.0mm · 0.53mm/px · 4 of 16 slices shown (1 of 2)]
[im 1/16]
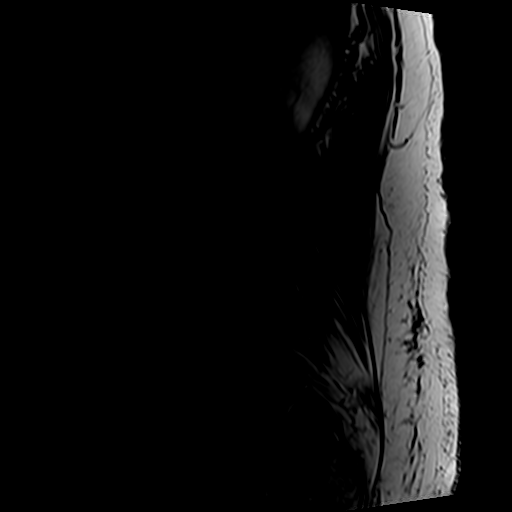
[im 2/16]
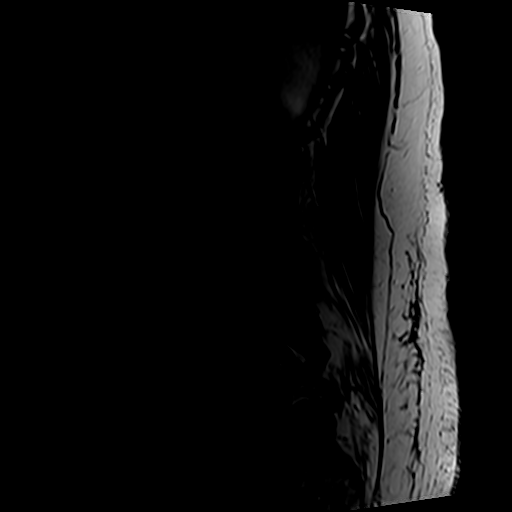
[im 8/16]
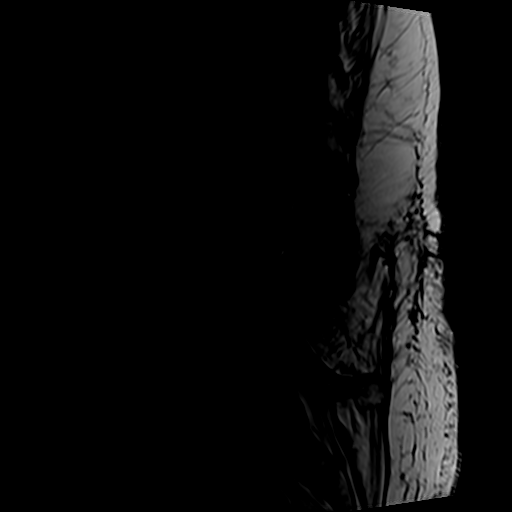
[im 14/16]
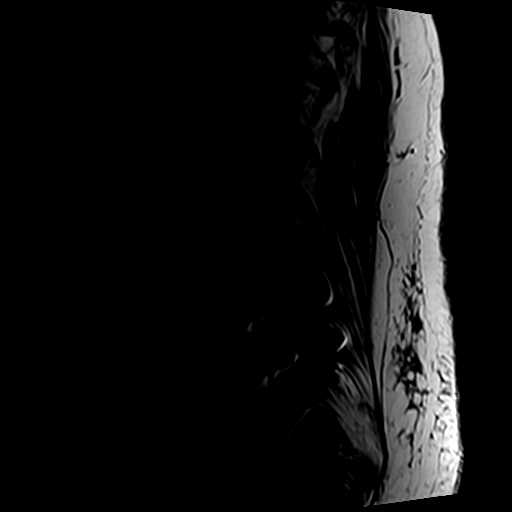

[Series 15: T2 · axial · 4.0mm · 0.70mm/px · z∈[-11,+177]mm · 11 of 20 slices shown (2 of 2)]
[im 1/20]
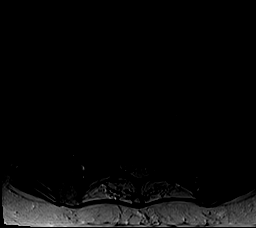
[im 2/20]
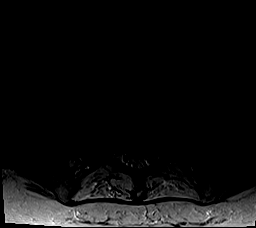
[im 4/20]
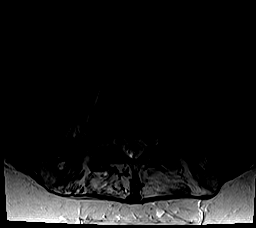
[im 6/20]
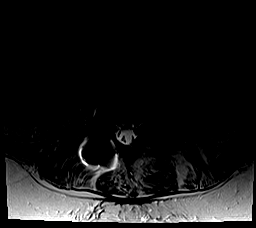
[im 8/20]
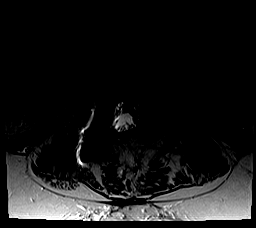
[im 10/20]
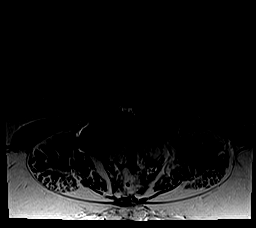
[im 12/20]
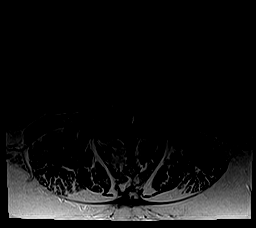
[im 14/20]
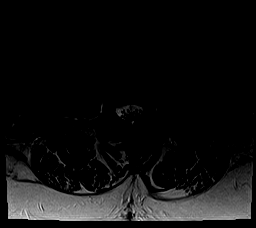
[im 16/20]
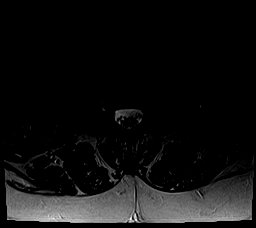
[im 18/20]
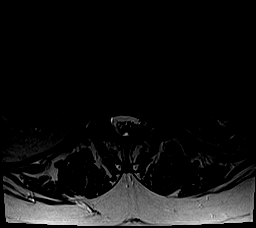
[im 20/20]
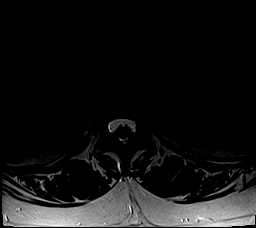

[Series 16: T1 · axial · 4.0mm · 0.35mm/px · z∈[-1,+157]mm · 3 of 20 slices shown (2 of 2)]
[im 2/20]
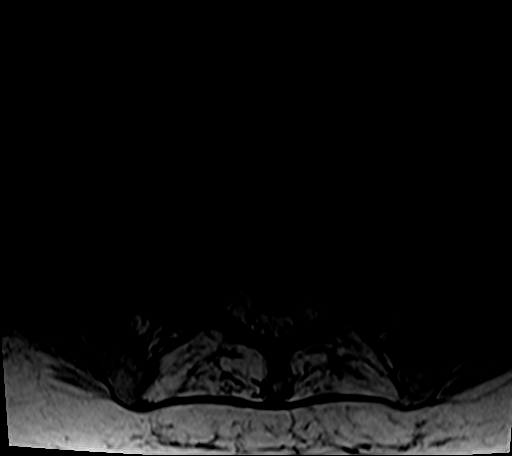
[im 10/20]
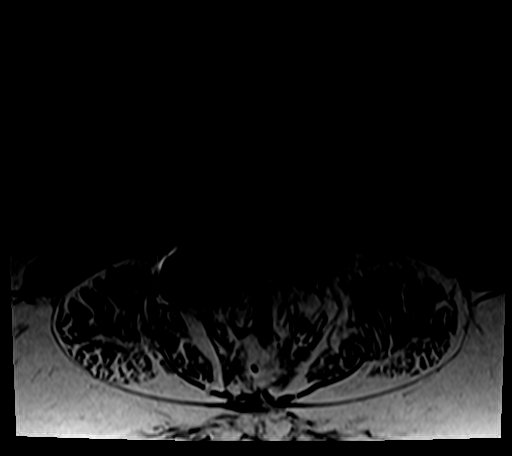
[im 18/20]
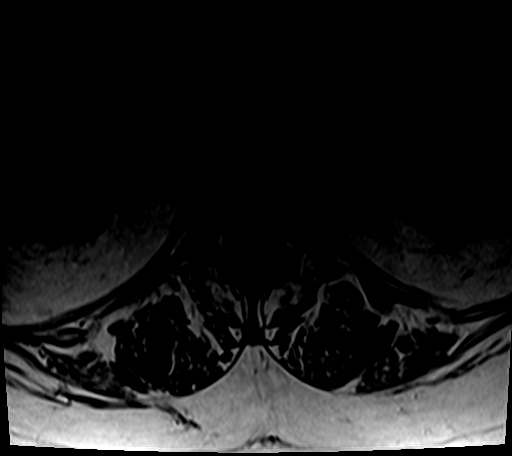

[26 of 48 positions shown; findings below may reference images not displayed]

FINDINGS: Segmentation:  Standard.

Alignment: 0.3 cm anterolisthesis L4 on L5 due to facet degenerative
change noted.

Vertebrae: No fracture, evidence of discitis, or bone lesion. The
patient is status post L4-5 fusion with pedicle screws and
stabilization bars in place on the right. Fusion across the L5-S1
disc interspace and facets is also seen.

Conus medullaris and cauda equina: Conus extends to the T12-L1
level. Conus and cauda equina appear normal.

Paraspinal and other soft tissues: Negative.

Disc levels:

T11-12 and T12-L1 are imaged in the sagittal plane only. There is a
shallow right paracentral protrusion at T10-11 without stenosis.
T11-12 is negative.

T12-L1: Shallow disc bulge without stenosis.

L1-2: There is loss of disc space height, ligamentum flavum
thickening and a broad-based right paracentral protrusion. There is
mild central canal and right foraminal narrowing. The left foramen
is open.

L2-3: There is a shallow disc bulge with a superimposed down turning
left subarticular recess disc extrusion. Mild to moderate central
canal stenosis is present at the level of the disc interspace. The
patient's disc extrusion impinges on the descending left L3 root.
The foramina are open.

L3-4: Status post laminectomy. Bilateral facet degenerative change
and a broad-based disc bulge are seen. The central canal is open.
Moderate bilateral foraminal narrowing is present.

L4-5: Status post discectomy and fusion. The central canal is open.
Mild bilateral foraminal narrowing is worse on the left.

L5-S1: Minimal disc bulge.  No stenosis.
IMPRESSION: Dominant findings are at L2-3 where there is a down turning left
subarticular recess disc extrusion impinging on the left L3 root.
There is mild to moderate central canal stenosis overall at L2-3.

Postoperative change L3-4 and L4-5. The central canal is open both
levels. Moderate bilateral foraminal narrowing is seen at L3-4 and
there is mild bilateral foraminal narrowing at L4-5.

Mild central canal and right foraminal narrowing L1-2.

## 2020-12-05 ENCOUNTER — Other Ambulatory Visit: Payer: Self-pay | Admitting: Family Medicine

## 2021-02-22 ENCOUNTER — Other Ambulatory Visit: Payer: Self-pay | Admitting: Physician Assistant

## 2021-03-07 ENCOUNTER — Other Ambulatory Visit: Payer: Self-pay | Admitting: Family Medicine

## 2021-03-29 ENCOUNTER — Other Ambulatory Visit: Payer: Self-pay | Admitting: *Deleted

## 2021-03-29 MED ORDER — LISINOPRIL 40 MG PO TABS: ORAL_TABLET | ORAL | 1 refills | Status: DC

## 2021-03-29 MED ORDER — SIMVASTATIN 40 MG PO TABS: ORAL_TABLET | ORAL | 1 refills | Status: DC

## 2021-03-29 MED ORDER — OMEPRAZOLE 40 MG PO CPDR: DELAYED_RELEASE_CAPSULE | ORAL | 1 refills | Status: DC

## 2021-03-29 MED ORDER — FENOFIBRATE 54 MG PO TABS: ORAL_TABLET | ORAL | 1 refills | Status: DC

## 2021-07-02 ENCOUNTER — Encounter: Payer: Self-pay | Admitting: Physician Assistant

## 2021-07-04 NOTE — Telephone Encounter (Signed)
Please advise 

## 2021-07-05 ENCOUNTER — Other Ambulatory Visit: Payer: Self-pay

## 2021-07-05 MED ORDER — DULOXETINE HCL 30 MG PO CPEP: 30.0000 mg | ORAL_CAPSULE | Freq: Two times a day (BID) | ORAL | 0 refills | Status: DC

## 2021-07-13 ENCOUNTER — Other Ambulatory Visit: Payer: Self-pay | Admitting: Physician Assistant

## 2021-07-20 ENCOUNTER — Encounter: Payer: Self-pay | Admitting: Physician Assistant

## 2021-07-20 ENCOUNTER — Ambulatory Visit (INDEPENDENT_AMBULATORY_CARE_PROVIDER_SITE_OTHER): Payer: Medicare HMO | Admitting: Physician Assistant

## 2021-07-20 VITALS — BP 130/80 | HR 104 | Temp 97.0°F | Ht 64.5 in | Wt 239.8 lb

## 2021-07-20 DIAGNOSIS — M159 Polyosteoarthritis, unspecified: Secondary | ICD-10-CM

## 2021-07-20 DIAGNOSIS — I10 Essential (primary) hypertension: Secondary | ICD-10-CM | POA: Diagnosis not present

## 2021-07-20 DIAGNOSIS — E782 Mixed hyperlipidemia: Secondary | ICD-10-CM

## 2021-07-20 DIAGNOSIS — Z6841 Body Mass Index (BMI) 40.0 and over, adult: Secondary | ICD-10-CM | POA: Diagnosis not present

## 2021-07-20 DIAGNOSIS — K2100 Gastro-esophageal reflux disease with esophagitis, without bleeding: Secondary | ICD-10-CM | POA: Diagnosis not present

## 2021-07-20 LAB — CBC WITH DIFFERENTIAL/PLATELET
Basophils Absolute: 0.1 10*3/uL (ref 0.0–0.1)
Basophils Relative: 0.8 % (ref 0.0–3.0)
Eosinophils Absolute: 0.1 10*3/uL (ref 0.0–0.7)
Eosinophils Relative: 1.5 % (ref 0.0–5.0)
HCT: 40.7 % (ref 36.0–46.0)
Hemoglobin: 14 g/dL (ref 12.0–15.0)
Lymphocytes Relative: 25.2 % (ref 12.0–46.0)
Lymphs Abs: 1.6 10*3/uL (ref 0.7–4.0)
MCHC: 34.4 g/dL (ref 30.0–36.0)
MCV: 90 fl (ref 78.0–100.0)
Monocytes Absolute: 0.4 10*3/uL (ref 0.1–1.0)
Monocytes Relative: 5.8 % (ref 3.0–12.0)
Neutro Abs: 4.1 10*3/uL (ref 1.4–7.7)
Neutrophils Relative %: 66.7 % (ref 43.0–77.0)
Platelets: 219 10*3/uL (ref 150.0–400.0)
RBC: 4.52 Mil/uL (ref 3.87–5.11)
RDW: 14 % (ref 11.5–15.5)
WBC: 6.2 10*3/uL (ref 4.0–10.5)

## 2021-07-20 LAB — COMPREHENSIVE METABOLIC PANEL
ALT: 30 U/L (ref 0–35)
AST: 26 U/L (ref 0–37)
Albumin: 4.6 g/dL (ref 3.5–5.2)
Alkaline Phosphatase: 93 U/L (ref 39–117)
BUN: 24 mg/dL — ABNORMAL HIGH (ref 6–23)
CO2: 28 mEq/L (ref 19–32)
Calcium: 10.1 mg/dL (ref 8.4–10.5)
Chloride: 104 mEq/L (ref 96–112)
Creatinine, Ser: 0.8 mg/dL (ref 0.40–1.20)
GFR: 72.12 mL/min (ref >60.00)
Glucose, Bld: 119 mg/dL — ABNORMAL HIGH (ref 70–99)
Potassium: 4.3 mEq/L (ref 3.5–5.1)
Sodium: 141 mEq/L (ref 135–145)
Total Bilirubin: 0.4 mg/dL (ref 0.2–1.2)
Total Protein: 7.1 g/dL (ref 6.0–8.3)

## 2021-07-20 LAB — LIPID PANEL
Cholesterol: 193 mg/dL (ref 0–200)
HDL: 54.4 mg/dL (ref >39.00)
LDL Cholesterol: 102 mg/dL — ABNORMAL HIGH (ref 0–99)
NonHDL: 138.27
Total CHOL/HDL Ratio: 4
Triglycerides: 179 mg/dL — ABNORMAL HIGH (ref 0.0–149.0)
VLDL: 35.8 mg/dL (ref 0.0–40.0)

## 2021-07-20 MED ORDER — FENOFIBRATE 54 MG PO TABS: ORAL_TABLET | ORAL | 1 refills | Status: DC

## 2021-07-20 MED ORDER — SIMVASTATIN 40 MG PO TABS: ORAL_TABLET | ORAL | 1 refills | Status: DC

## 2021-07-20 MED ORDER — OXYCODONE-ACETAMINOPHEN 5-325 MG PO TABS: 1.0000 | ORAL_TABLET | ORAL | 0 refills | Status: DC | PRN

## 2021-07-20 MED ORDER — LISINOPRIL 40 MG PO TABS: ORAL_TABLET | ORAL | 1 refills | Status: DC

## 2021-07-20 MED ORDER — OMEPRAZOLE 40 MG PO CPDR: DELAYED_RELEASE_CAPSULE | ORAL | 1 refills | Status: DC

## 2021-07-20 MED ORDER — DULOXETINE HCL 30 MG PO CPEP: 30.0000 mg | ORAL_CAPSULE | Freq: Two times a day (BID) | ORAL | 3 refills | Status: DC

## 2021-07-20 NOTE — Patient Instructions (Signed)
It was great to see you! ? ?Refills for 3 months ? ?Update blood work today ? ?Drug contract signed today ? ?Let's follow-up in 3 months, sooner if you have concerns. ? ?Take care, ? ?Inda Coke PA-C  ?

## 2021-07-20 NOTE — Progress Notes (Signed)
Diana Arellano is a 76 y.o. female here for a follow up of a pre-existing problem. [name]:  ? ?Chief Complaint  ?Patient presents with  ? Annual Exam  ?  Pt here for Annual exam and is not fasting.  ? ? ?HPI ? ?HTN ?Currently compliant with taking zestril 40 mg daily with no adverse effects. At home blood pressure readings are: not checked. Patient denies chest pain, SOB, blurred vision, dizziness, unusual headaches, lower leg swelling. Denies excessive caffeine intake, stimulant usage, excessive alcohol intake, or increase in salt consumption. ? ?BP Readings from Last 3 Encounters:  ?07/20/21 130/80  ?02/27/20 118/70  ?01/03/14 160/78  ? ? ?HLD ?Diana Arellano is compliant with taking simvastatin 40 mg daily and fenofibrate 54 mg daily with no complications. She is managing well at this time. Denies CP or SOB.  ? ?GERD ?Diana Arellano is currently compliant with taking prilosec 40 mg daily with no complications. She has found this medication to be beneficial for this issue. She is tolerating well. Denies concerning sx.  ? ?Generalized Osteoarthritis  ?Pt has been dealing with this issue for several years and has previously had this issue managed by pain management. While regularly following up with pain management she was prescribed oxycodone-acetaminophen 5 mg every 4 hours as needed for severe pain which she found to provide temporary relief. Although she found this beneficial, she soon stopped following up with pain management due to belief that they couldn't do anything further for her. At this time she is compliant with with taking cymbalta 30 mg twice daily, but is wondering if she could restart the oxycodone-acetaminophen 5 mg as needed for her sever bouts of pain. Despite this she has been managing well.  ? ? ?Past Medical History:  ?Diagnosis Date  ? Cataract   ? Complication of anesthesia   ? diff to start stream  ? GERD (gastroesophageal reflux disease)   ? well controlled with medication  ?  Headache(784.0)   ? from cervical radiculopathy  ? Heart murmur   ? takes amoxicillin before dental appointments; was told at one point she had one  ? ?  ?Social History  ? ?Tobacco Use  ? Smoking status: Never  ? Smokeless tobacco: Never  ?Vaping Use  ? Vaping Use: Never used  ?Substance Use Topics  ? Alcohol use: Yes  ?  Alcohol/week: 10.0 standard drinks  ?  Types: 10 Shots of liquor per week  ?  Comment: daily  ? Drug use: No  ? ? ?Past Surgical History:  ?Procedure Laterality Date  ? ABDOMINAL HYSTERECTOMY  08/1993  ? BACK SURGERY  12/2011  ? CARPAL TUNNEL RELEASE Right 2017  ? CATARACT EXTRACTION W/ INTRAOCULAR LENS IMPLANT Right 08/20/2017  ? CATARACT EXTRACTION W/ INTRAOCULAR LENS IMPLANT Left 09/10/2017  ? JOINT REPLACEMENT  08/2006  ? bil  knees replaced  ? ? ?Family History  ?Problem Relation Age of Onset  ? Alzheimer's disease Father   ? Hypertension Sister   ? Kidney disease Sister   ? Diabetes Brother   ? Heart attack Brother   ? Prostate cancer Brother   ? Alzheimer's disease Maternal Grandmother   ? Alzheimer's disease Paternal Grandmother   ? ? ?Allergies  ?Allergen Reactions  ? Benzalkonium Chloride Other (See Comments)  ?  inflammed  ? Neosporin [Neomycin-Bacitracin Zn-Polymyx] Itching  ?  inflammation  ? Other Itching  ?  inflammation ?inflammation  ? Sulfa Antibiotics Itching  ?  inflammation  ? ? ?Current  Medications:  ? ?Current Outpatient Medications:  ?  acetaminophen (TYLENOL) 500 MG tablet, Take 1,000 mg by mouth every 6 (six) hours as needed., Disp: , Rfl:  ?  aspirin EC 81 MG tablet, Take 243 mg by mouth every morning. , Disp: , Rfl:  ?  Cholecalciferol (VITAMIN D-3) 25 MCG (1000 UT) CAPS, Take 1 capsule by mouth daily., Disp: , Rfl:  ?  diclofenac Sodium (VOLTAREN) 1 % GEL, Apply 2 g topically 4 (four) times daily., Disp: , Rfl:  ?  naproxen sodium (ANAPROX) 220 MG tablet, Take 660 mg by mouth 2 (two) times daily as needed (pain)., Disp: , Rfl:  ?  nystatin ointment (MYCOSTATIN), Apply  to affected area 1-2 times daily, Disp: 30 g, Rfl: 0 ?  triamcinolone (KENALOG) 0.1 %, Apply topically 2 (two) times daily., Disp: 30 g, Rfl: 2 ?  DULoxetine (CYMBALTA) 30 MG capsule, Take 1 capsule (30 mg total) by mouth every 12 (twelve) hours., Disp: 180 capsule, Rfl: 3 ?  fenofibrate 54 MG tablet, 1 tablet by mouth daily, Disp: 90 tablet, Rfl: 1 ?  lisinopril (ZESTRIL) 40 MG tablet, TAKE 1 TABLET(40 MG) BY MOUTH AT BEDTIME, Disp: 90 tablet, Rfl: 1 ?  omeprazole (PRILOSEC) 40 MG capsule, TAKE 1 CAPSULE(40 MG) BY MOUTH EVERY EVENING, Disp: 90 capsule, Rfl: 1 ?  oxyCODONE-acetaminophen (PERCOCET/ROXICET) 5-325 MG tablet, Take 1 tablet by mouth every 4 (four) hours as needed for severe pain., Disp: 30 tablet, Rfl: 0 ?  simvastatin (ZOCOR) 40 MG tablet, TAKE 1 TABLET(40 MG) BY MOUTH AT BEDTIME, Disp: 90 tablet, Rfl: 1  ? ?Review of Systems:  ? ?ROS ?Negative unless otherwise specified per HPI. ?Vitals:  ? ?Vitals:  ? 07/20/21 1057  ?BP: 130/80  ?Pulse: (!) 104  ?Temp: (!) 97 ?F (36.1 ?C)  ?SpO2: 95%  ?Weight: 239 lb 12.8 oz (108.8 kg)  ?Height: 5' 4.5" (1.638 m)  ?   ?Body mass index is 40.53 kg/m?. ? ?Physical Exam:  ? ?Physical Exam ?Vitals and nursing note reviewed.  ?Constitutional:   ?   General: She is not in acute distress. ?   Appearance: She is well-developed. She is not ill-appearing or toxic-appearing.  ?Cardiovascular:  ?   Rate and Rhythm: Normal rate and regular rhythm.  ?   Pulses: Normal pulses.  ?   Heart sounds: Normal heart sounds, S1 normal and S2 normal.  ?Pulmonary:  ?   Effort: Pulmonary effort is normal.  ?   Breath sounds: Normal breath sounds.  ?Skin: ?   General: Skin is warm and dry.  ?Neurological:  ?   Mental Status: She is alert.  ?   GCS: GCS eye subscore is 4. GCS verbal subscore is 5. GCS motor subscore is 6.  ?Psychiatric:     ?   Speech: Speech normal.     ?   Behavior: Behavior normal. Behavior is cooperative.  ? ? ?Assessment and Plan:  ? ?Benign essential  hypertension ?Controlled ?Continue zestril 40 mg daily ?Monitor BP regularly 1-2 times a week ?Encouraged patient to continue participating in healthy eating and daily exercise ?I advised patient that if BP readings are consistently >150/90, to reach out to office and medication will be adjusted ?Follow up in 3 months, sooner if concerns ? ?Gastroesophageal reflux disease with esophagitis, unspecified whether hemorrhage ?Controlled ?Continue prilosec 40 mg daily ?Refill prilosec today ?Follow up as needed ? ?Generalized OA ?Stable ?Drug contract signed, patient verbalized understanding ?Continue cymbalta 30 mg twice daily  ?  May take oxycodone-acetaminophen 5 mg daily as needed for severe pain ?Drug contract signed today for patient ?Follow up in 3 months, sooner if concerns ? ?Mixed hyperlipidemia ?Update lipid profile, will adjust medications as indicated by results  ?Continue simvastatin 40 mg and fenofibrate 54 mg daily  ?Refill simvastatin and fenofibrate today ?Follow up in 3 months, sooner if concerns ? ?I,Havlyn C Ratchford,acting as a scribe for [employer], PA.,have documented all relevant documentation on the behalf of Jarold Motto, PA,as directed by  Jarold Motto, PA while in the presence of Jarold Motto, [place]. ? ?IJarold Motto, PA, have reviewed all documentation for this visit. The documentation on 07/20/21 for the exam, diagnosis, procedures, and orders are all accurate and complete. ? ?Jarold Motto, PA-C ? ?

## 2021-07-21 ENCOUNTER — Encounter: Payer: Self-pay | Admitting: Physician Assistant

## 2021-07-22 ENCOUNTER — Other Ambulatory Visit: Payer: Self-pay | Admitting: Physician Assistant

## 2021-07-22 MED ORDER — SIMVASTATIN 80 MG PO TABS: ORAL_TABLET | ORAL | 1 refills | Status: DC

## 2021-10-07 ENCOUNTER — Other Ambulatory Visit: Payer: Self-pay

## 2021-10-07 ENCOUNTER — Emergency Department (HOSPITAL_BASED_OUTPATIENT_CLINIC_OR_DEPARTMENT_OTHER)
Admission: EM | Admit: 2021-10-07 | Discharge: 2021-10-07 | Disposition: A | Discharge status: Home / Self Care | Payer: Medicare HMO | Attending: Emergency Medicine | Admitting: Emergency Medicine

## 2021-10-07 ENCOUNTER — Telehealth: Payer: Self-pay | Admitting: Physician Assistant

## 2021-10-07 ENCOUNTER — Encounter (HOSPITAL_BASED_OUTPATIENT_CLINIC_OR_DEPARTMENT_OTHER): Payer: Self-pay | Admitting: Emergency Medicine

## 2021-10-07 DIAGNOSIS — R300 Dysuria: Secondary | ICD-10-CM | POA: Diagnosis not present

## 2021-10-07 DIAGNOSIS — Z7982 Long term (current) use of aspirin: Secondary | ICD-10-CM | POA: Diagnosis not present

## 2021-10-07 DIAGNOSIS — N390 Urinary tract infection, site not specified: Secondary | ICD-10-CM | POA: Diagnosis not present

## 2021-10-07 DIAGNOSIS — R3915 Urgency of urination: Secondary | ICD-10-CM | POA: Diagnosis present

## 2021-10-07 LAB — URINALYSIS, ROUTINE W REFLEX MICROSCOPIC
Bilirubin Urine: NEGATIVE
Glucose, UA: NEGATIVE mg/dL
Ketones, ur: NEGATIVE mg/dL
Nitrite: POSITIVE — AB
Protein, ur: 30 mg/dL — AB
Specific Gravity, Urine: 1.028 (ref 1.005–1.030)
WBC, UA: >50 WBC/hpf — ABNORMAL HIGH (ref 0–5)
pH: 7 (ref 5.0–8.0)

## 2021-10-07 MED ORDER — CEPHALEXIN 500 MG PO CAPS: 500.0000 mg | ORAL_CAPSULE | Freq: Four times a day (QID) | ORAL | 0 refills | Status: DC

## 2021-10-07 MED ORDER — CEPHALEXIN 250 MG PO CAPS
500.0000 mg | ORAL_CAPSULE | Freq: Once | ORAL | Status: AC
  Administered 2021-10-07: 500 mg via ORAL
  Filled 2021-10-07: qty 2

## 2021-10-07 NOTE — ED Triage Notes (Signed)
Starting around 1pm today started to feel urinary urgency. Then felt discomfort when urinating."Feels uti-ish"

## 2021-10-07 NOTE — ED Provider Notes (Signed)
[facility] EMERGENCY DEPT Provider Note   CSN: [id number] Arrival date & time: 10/07/21  1634     History {Add pertinent medical, surgical, social history, OB history to HPI:1} Chief Complaint  Patient presents with   Urinary Urgency    Diana Arellano is a 76 y.o. female.  HPI     Home Medications Prior to Admission medications   Medication Sig Start Date End Date Taking? Authorizing Provider  acetaminophen (TYLENOL) 500 MG tablet Take 1,000 mg by mouth every 6 (six) hours as needed.    [provider]  aspirin EC 81 MG tablet Take 243 mg by mouth every morning.     [provider]  Cholecalciferol (VITAMIN D-3) 25 MCG (1000 UT) CAPS Take 1 capsule by mouth daily.    [provider]  diclofenac Sodium (VOLTAREN) 1 % GEL Apply 2 g topically 4 (four) times daily.    [provider]  DULoxetine (CYMBALTA) 30 MG capsule Take 1 capsule (30 mg total) by mouth every 12 (twelve) hours. 07/20/21   Jarold Motto, PA  fenofibrate 54 MG tablet 1 tablet by mouth daily 07/20/21   Jarold Motto, PA  lisinopril (ZESTRIL) 40 MG tablet TAKE 1 TABLET(40 MG) BY MOUTH AT BEDTIME 07/20/21   Jarold Motto, PA  naproxen sodium (ANAPROX) 220 MG tablet Take 660 mg by mouth 2 (two) times daily as needed (pain).    [provider]  nystatin ointment (MYCOSTATIN) Apply to affected area 1-2 times daily 06/15/20   Jarold Motto, PA  omeprazole (PRILOSEC) 40 MG capsule TAKE 1 CAPSULE(40 MG) BY MOUTH EVERY EVENING 07/20/21   Jarold Motto, PA  oxyCODONE-acetaminophen (PERCOCET/ROXICET) 5-325 MG tablet Take 1 tablet by mouth every 4 (four) hours as needed for severe pain. 07/20/21   Jarold Motto, PA  simvastatin (ZOCOR) 80 MG tablet TAKE 1 TABLET(40 MG) BY MOUTH AT BEDTIME 07/22/21   Jarold Motto, PA  triamcinolone (KENALOG) 0.1 % Apply topically 2 (two) times daily. 06/15/20   Jarold Motto, PA      Allergies    Benzalkonium  chloride, Neosporin [neomycin-bacitracin zn-polymyx], Other, and Sulfa antibiotics    Review of Systems   Review of Systems  Physical Exam Updated Vital Signs BP (!) 145/108 (BP Location: Right Arm)   Pulse 92   Temp 98.6 F (37 C) (Oral)   Resp 18   Ht 5\' 5"  (1.651 m)   Wt 104.3 kg   SpO2 97%   BMI 38.27 kg/m  Physical Exam  ED Results / Procedures / Treatments   Labs (all labs ordered are listed, but only abnormal results are displayed) Labs Reviewed  URINALYSIS, ROUTINE W REFLEX MICROSCOPIC - Abnormal; Notable for the following components:      Result Value   APPearance HAZY (*)    Hgb urine dipstick LARGE (*)    Protein, ur 30 (*)    Nitrite POSITIVE (*)    Leukocytes,Ua LARGE (*)    WBC, UA >50 (*)    Bacteria, UA FEW (*)    All other components within normal limits    EKG None  Radiology No results found.  Procedures Procedures  {Document cardiac monitor, telemetry assessment procedure when appropriate:1}  Medications Ordered in ED Medications  cephALEXin (KEFLEX) capsule 500 mg (has no administration in time range)    ED Course/ Medical Decision Making/ A&P  Medical Decision Making Amount and/or Complexity of Data Reviewed Labs: ordered.  Risk Prescription drug management.   ***  {Document critical care time when appropriate:1} {Document review of labs and clinical decision tools ie heart score, Chads2Vasc2 etc:1}  {Document your independent review of radiology images, and any outside records:1} {Document your discussion with family members, caretakers, and with consultants:1} {Document social determinants of health affecting pt's care:1} {Document your decision making why or why not admission, treatments were needed:1} Final Clinical Impression(s) / ED Diagnoses Final diagnoses:  None    Rx / DC Orders ED Discharge Orders     None

## 2021-10-07 NOTE — Telephone Encounter (Signed)
Spoke to pt , told her calling about message thinks she has a  UTI. Asked her what symptoms she is having? Pt said urgency and just started having burning. Asked pt if fever, chills or back pain? Pt denied all symptoms. Told pt I would really like you to go to an Urgent care to be evaluated instead of waiting till Monday. Pt said he husband is taking her right now. Told her okay good.

## 2021-10-07 NOTE — Discharge Instructions (Signed)
Follow-up with your PCP.  Take antibiotic as prescribed.  Come back to ER if you develop abdominal pain, vomiting, fever, or other new concerning symptom.

## 2021-10-07 NOTE — Telephone Encounter (Signed)
Pt states she believes she has a uti and wanted meds called in. Stated she would need ov visit. Due to the late hr, advised triage or urgent care. Pt refused. Asked for first available appt. Scheduled with [place] at 8am.

## 2021-10-10 ENCOUNTER — Ambulatory Visit: Payer: Medicare HMO | Admitting: Physician Assistant

## 2021-10-21 ENCOUNTER — Ambulatory Visit (INDEPENDENT_AMBULATORY_CARE_PROVIDER_SITE_OTHER): Payer: Medicare HMO | Admitting: Physician Assistant

## 2021-10-21 ENCOUNTER — Encounter: Payer: Self-pay | Admitting: Physician Assistant

## 2021-10-21 VITALS — BP 130/74 | HR 96 | Ht 65.0 in | Wt 239.5 lb

## 2021-10-21 DIAGNOSIS — M159 Polyosteoarthritis, unspecified: Secondary | ICD-10-CM

## 2021-10-21 DIAGNOSIS — K2100 Gastro-esophageal reflux disease with esophagitis, without bleeding: Secondary | ICD-10-CM

## 2021-10-21 DIAGNOSIS — R399 Unspecified symptoms and signs involving the genitourinary system: Secondary | ICD-10-CM

## 2021-10-21 DIAGNOSIS — W19XXXA Unspecified fall, initial encounter: Secondary | ICD-10-CM | POA: Diagnosis not present

## 2021-10-21 DIAGNOSIS — M25531 Pain in right wrist: Secondary | ICD-10-CM

## 2021-10-21 DIAGNOSIS — I10 Essential (primary) hypertension: Secondary | ICD-10-CM

## 2021-10-21 MED ORDER — OXYCODONE-ACETAMINOPHEN 5-325 MG PO TABS: 1.0000 | ORAL_TABLET | ORAL | 0 refills | Status: DC | PRN

## 2021-10-21 MED ORDER — CEPHALEXIN 250 MG PO CAPS: 250.0000 mg | ORAL_CAPSULE | Freq: Every day | ORAL | 0 refills | Status: DC | PRN

## 2021-10-21 NOTE — Progress Notes (Signed)
Diana Arellano is a 76 y.o. female here for a follow up on HTN.   History of Present Illness:   Chief Complaint  Patient presents with   Hypertension    HPI  HTN Currently taking Zestril 40 mg daily. At home blood pressure readings are: not checked. Patient denies chest pain, SOB, blurred vision, dizziness, unusual headaches, lower leg swelling. Patient is compliant with medication. Denies excessive caffeine intake, stimulant usage, excessive alcohol intake, or increase in salt consumption.  BP Readings from Last 3 Encounters:  10/21/21 130/74  10/07/21 (!) 151/78  07/20/21 130/80     Fall Patient complain of fall about 5 days ago. States she was working in her backyard when this happened. She landed on her left hip side but denies any severe pain. Has had low back pain but she does have hx of sciatica. Has noticed some bruise in bilateral hands. States some soreness in her right knee as well in right wrist. She is currently wearing brace in her left hand that she uses for her OA. Has tried some cold compression with relief. Denies any severe pain. Denies any other injury or trauma. Denies any other concerning sx.   Generalized Osteoarthritis  She is currently compliant taking Cymbalta 30 mg twice daily and Oxycodone-acetaminophen 5 mg daily as needed for severe pain. States this as been working well for her. She is requesting refill on Oxycodone today. Denies any concerning sx.    UTI Patient has had some UTI symptoms for the past few weeks. She was seen in the ED for this on 10/07/2021. Does feel like previous UTI. Had UA which was positive for UTI. She was given Keflex in the ED and was advised to follow up with PCP. She is requesting  to refill Keflex for future UTI symptoms. Denies any symptoms at this time.    Past Medical History:  Diagnosis Date   Cataract    Complication of anesthesia    diff to start stream   GERD (gastroesophageal reflux disease)    well controlled  with medication   Headache(784.0)    from cervical radiculopathy   Heart murmur    takes amoxicillin before dental appointments; was told at one point she had one     Social History   Tobacco Use   Smoking status: Never   Smokeless tobacco: Never  Vaping Use   Vaping Use: Never used  Substance Use Topics   Alcohol use: Yes    Alcohol/week: 10.0 standard drinks of alcohol    Types: 10 Shots of liquor per week    Comment: daily   Drug use: No    Past Surgical History:  Procedure Laterality Date   ABDOMINAL HYSTERECTOMY  08/1993   BACK SURGERY  12/2011   CARPAL TUNNEL RELEASE Right 2017   CATARACT EXTRACTION W/ INTRAOCULAR LENS IMPLANT Right 08/20/2017   CATARACT EXTRACTION W/ INTRAOCULAR LENS IMPLANT Left 09/10/2017   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    Family History  Problem Relation Age of Onset   Alzheimer's disease Father    Hypertension Sister    Kidney disease Sister    Diabetes Brother    Heart attack Brother    Prostate cancer Brother    Alzheimer's disease Maternal Grandmother    Alzheimer's disease Paternal Grandmother     Allergies  Allergen Reactions   Benzalkonium Chloride Other (See Comments)    inflammed   Neosporin [Neomycin-Bacitracin Zn-Polymyx] Itching    inflammation  Other Itching    inflammation inflammation   Sulfa Antibiotics Itching    inflammation    Current Medications:   Current Outpatient Medications:    acetaminophen (TYLENOL) 500 MG tablet, Take 1,000 mg by mouth every 6 (six) hours as needed., Disp: , Rfl:    aspirin EC 81 MG tablet, Take 243 mg by mouth every morning. , Disp: , Rfl:    celecoxib (CELEBREX) 200 MG capsule, Take 200 mg by mouth daily., Disp: , Rfl:    cephALEXin (KEFLEX) 250 MG capsule, Take 1 capsule (250 mg total) by mouth daily as needed (for UTI symptoms)., Disp: 30 capsule, Rfl: 0   Cholecalciferol (VITAMIN D-3) 25 MCG (1000 UT) CAPS, Take 1 capsule by mouth daily., Disp: , Rfl:     diclofenac Sodium (VOLTAREN) 1 % GEL, Apply 2 g topically 4 (four) times daily., Disp: , Rfl:    DULoxetine (CYMBALTA) 30 MG capsule, Take 1 capsule (30 mg total) by mouth every 12 (twelve) hours., Disp: 180 capsule, Rfl: 3   fenofibrate 54 MG tablet, 1 tablet by mouth daily, Disp: 90 tablet, Rfl: 1   lisinopril (ZESTRIL) 40 MG tablet, TAKE 1 TABLET(40 MG) BY MOUTH AT BEDTIME, Disp: 90 tablet, Rfl: 1   omeprazole (PRILOSEC) 40 MG capsule, TAKE 1 CAPSULE(40 MG) BY MOUTH EVERY EVENING, Disp: 90 capsule, Rfl: 1   simvastatin (ZOCOR) 80 MG tablet, TAKE 1 TABLET(40 MG) BY MOUTH AT BEDTIME, Disp: 90 tablet, Rfl: 1   nystatin ointment (MYCOSTATIN), Apply to affected area 1-2 times daily (Patient not taking: Reported on 10/21/2021), Disp: 30 g, Rfl: 0   oxyCODONE-acetaminophen (PERCOCET/ROXICET) 5-325 MG tablet, Take 1 tablet by mouth every 4 (four) hours as needed for severe pain., Disp: 30 tablet, Rfl: 0   triamcinolone (KENALOG) 0.1 %, Apply topically 2 (two) times daily. (Patient not taking: Reported on 10/21/2021), Disp: 30 g, Rfl: 2   Review of Systems:   ROS Negative unless otherwise specified per HPI.   Vitals:   Vitals:   10/21/21 1329  BP: 130/74  Pulse: 96  SpO2: 97%  Weight: 239 lb 8 oz (108.6 kg)  Height: 5\' 5"  (1.651 m)     Body mass index is 39.85 kg/m.  Physical Exam:   Physical Exam Vitals and nursing note reviewed.  Constitutional:      General: She is not in acute distress.    Appearance: She is well-developed. She is not ill-appearing or toxic-appearing.  Cardiovascular:     Rate and Rhythm: Normal rate and regular rhythm.     Pulses: Normal pulses.     Heart sounds: Normal heart sounds, S1 normal and S2 normal.  Pulmonary:     Effort: Pulmonary effort is normal.     Breath sounds: Normal breath sounds.  Musculoskeletal:     Comments: No TTP to spine or b/l hips Has tenderness to R radial aspect of wrist, with bruising to ulnar aspect. No snuff box  tenderness. Normal ROM of R wrist.  Skin:    General: Skin is warm and dry.  Neurological:     Mental Status: She is alert.     GCS: GCS eye subscore is 4. GCS verbal subscore is 5. GCS motor subscore is 6.  Psychiatric:        Speech: Speech normal.        Behavior: Behavior normal. Behavior is cooperative.     Assessment and Plan:   Benign essential hypertension Normotensive Continue lisinopril 40 mg daily Follow-up in 6  mo, sooner if concerns  Fall, initial encounter; R wrist pain New She declines need for xray or intervention Recommended immobilization with brace -- she will consider Follow-up as needed  Generalized OA Well controlled Continue cymbalta 60 mg daily and celebrex 200 mg Will refill oxycocodone- acetaminophen 5-325 mg - no red flags on pdmp and this medication improves her quality of life without significant side effects  UTI symptoms Currently asymptomatic Provided 250 mg keflex for UTI prophylaxis Discussed when/why she should and should not take this Advised to follow-up if taking and does not help symptoms or other concerns.   I,Savera Zaman,acting as a Neurosurgeon for [employer], PA.,have documented all relevant documentation on the behalf of Jarold Motto, PA,as directed by  Jarold Motto, PA while in the presence of Jarold Motto, [place].   I, Jarold Motto, [place], have reviewed all documentation for this visit. The documentation on 10/21/21 for the exam, diagnosis, procedures, and orders are all accurate and complete.   Jarold Motto, PA-C

## 2021-10-21 NOTE — Patient Instructions (Signed)
It was great to see you!  Use the keflex 250 mg daily as needed only for when you develop UTI symptoms -- please still come see [place] if you have UTI symptoms  Pain med refilled  Let's follow-up in 3 months, sooner if you have concerns.  Take care,  Jarold Motto PA-C

## 2021-11-11 ENCOUNTER — Other Ambulatory Visit: Payer: Self-pay | Admitting: Physician Assistant

## 2021-12-27 ENCOUNTER — Telehealth: Payer: Self-pay | Admitting: Physician Assistant

## 2021-12-27 NOTE — Telephone Encounter (Signed)
Spoke to pt told her Diana Arellano said if COVID booster is available at the pharmacy she does recommend you get. Pt verbalized understanding.

## 2021-12-27 NOTE — Telephone Encounter (Signed)
Patient states: - She has received 4 covid vaccination in total, 2 doses of Moderna vaccine and 2 boosters.  - She has seen that COVID is on the rise again   Patient requests: - Advice from PCP that if an additional booster comes out, should she get this completed?   Please Advise.

## 2022-01-16 ENCOUNTER — Encounter: Payer: Self-pay | Admitting: *Deleted

## 2022-01-18 ENCOUNTER — Encounter: Payer: Self-pay | Admitting: Physician Assistant

## 2022-01-18 ENCOUNTER — Ambulatory Visit (INDEPENDENT_AMBULATORY_CARE_PROVIDER_SITE_OTHER): Payer: Medicare HMO | Admitting: Physician Assistant

## 2022-01-18 VITALS — BP 130/70 | HR 106 | Temp 98.0°F | Ht 65.0 in | Wt 237.0 lb

## 2022-01-18 DIAGNOSIS — Z23 Encounter for immunization: Secondary | ICD-10-CM

## 2022-01-18 DIAGNOSIS — I10 Essential (primary) hypertension: Secondary | ICD-10-CM | POA: Diagnosis not present

## 2022-01-18 DIAGNOSIS — M159 Polyosteoarthritis, unspecified: Secondary | ICD-10-CM

## 2022-01-18 MED ORDER — OXYCODONE-ACETAMINOPHEN 5-325 MG PO TABS: 1.0000 | ORAL_TABLET | ORAL | 0 refills | Status: DC | PRN

## 2022-01-18 NOTE — Progress Notes (Signed)
Diana Arellano is a 76 y.o. female here for a follow up of a pre-existing problem.  History of Present Illness:   Chief Complaint  Patient presents with   Hypertension    HTN Currently taking lisinopril 40 mg. At home blood pressure readings are: good. Patient denies chest pain, SOB, blurred vision, dizziness, unusual headaches, lower leg swelling. Patient is compliant with medication. Denies excessive caffeine intake, stimulant usage, excessive alcohol intake, or increase in salt consumption.  BP Readings from Last 3 Encounters:  01/18/22 130/70  10/21/21 130/74  10/07/21 (!) 151/78    Generalized OA Currently taking oxycodone-acetaminophen 5-325 mg QID prn. She feels like this is not effective for her pain. She is taking celebrex 200 mg daily and cymbalta 30 mg BID.    Past Medical History:  Diagnosis Date   Cataract    Complication of anesthesia    diff to start stream   GERD (gastroesophageal reflux disease)    well controlled with medication   Headache(784.0)    from cervical radiculopathy   Heart murmur    takes amoxicillin before dental appointments; was told at one point she had one     Social History   Tobacco Use   Smoking status: Never   Smokeless tobacco: Never  Vaping Use   Vaping Use: Never used  Substance Use Topics   Alcohol use: Yes    Alcohol/week: 10.0 standard drinks of alcohol    Types: 10 Shots of liquor per week    Comment: daily   Drug use: No    Past Surgical History:  Procedure Laterality Date   ABDOMINAL HYSTERECTOMY  08/1993   BACK SURGERY  12/2011   CARPAL TUNNEL RELEASE Right 2017   CATARACT EXTRACTION W/ INTRAOCULAR LENS IMPLANT Right 08/20/2017   CATARACT EXTRACTION W/ INTRAOCULAR LENS IMPLANT Left 09/10/2017   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    Family History  Problem Relation Age of Onset   Alzheimer's disease Father    Hypertension Sister    Kidney disease Sister    Diabetes Brother    Heart attack  Brother    Prostate cancer Brother    Alzheimer's disease Maternal Grandmother    Alzheimer's disease Paternal Grandmother     Allergies  Allergen Reactions   Benzalkonium Chloride Other (See Comments)    inflammed   Neosporin [Neomycin-Bacitracin Zn-Polymyx] Itching    inflammation   Other Itching    inflammation inflammation   Sulfa Antibiotics Itching    inflammation    Current Medications:   Current Outpatient Medications:    acetaminophen (TYLENOL) 500 MG tablet, Take 1,000 mg by mouth every 6 (six) hours as needed., Disp: , Rfl:    aspirin EC 81 MG tablet, Take 243 mg by mouth every morning. , Disp: , Rfl:    celecoxib (CELEBREX) 200 MG capsule, Take 200 mg by mouth daily., Disp: , Rfl:    Cholecalciferol (VITAMIN D-3) 25 MCG (1000 UT) CAPS, Take 1 capsule by mouth daily., Disp: , Rfl:    diclofenac Sodium (VOLTAREN) 1 % GEL, Apply 2 g topically 4 (four) times daily., Disp: , Rfl:    DULoxetine (CYMBALTA) 30 MG capsule, Take 1 capsule (30 mg total) by mouth every 12 (twelve) hours., Disp: 180 capsule, Rfl: 3   fenofibrate 54 MG tablet, 1 tablet by mouth daily, Disp: 90 tablet, Rfl: 1   lisinopril (ZESTRIL) 40 MG tablet, TAKE 1 TABLET(40 MG) BY MOUTH AT BEDTIME, Disp: 90 tablet, Rfl:  1   omeprazole (PRILOSEC) 40 MG capsule, TAKE 1 CAPSULE(40 MG) BY MOUTH EVERY EVENING, Disp: 90 capsule, Rfl: 1   oxyCODONE-acetaminophen (PERCOCET/ROXICET) 5-325 MG tablet, Take 1 tablet by mouth every 4 (four) hours as needed for severe pain., Disp: 30 tablet, Rfl: 0   simvastatin (ZOCOR) 80 MG tablet, TAKE 1 TABLET(40 MG) BY MOUTH AT BEDTIME, Disp: 90 tablet, Rfl: 1   nystatin ointment (MYCOSTATIN), Apply to affected area 1-2 times daily (Patient not taking: Reported on 10/21/2021), Disp: 30 g, Rfl: 0   triamcinolone (KENALOG) 0.1 %, Apply topically 2 (two) times daily. (Patient not taking: Reported on 10/21/2021), Disp: 30 g, Rfl: 2   Review of Systems:   ROS Negative unless otherwise  specified per HPI.  Vitals:   Vitals:   01/18/22 1341  BP: 130/70  Pulse: (!) 106  Temp: 98 F (36.7 C)  TempSrc: Temporal  SpO2: 96%  Weight: 237 lb (107.5 kg)  Height: 5\' 5"  (1.651 m)     Body mass index is 39.44 kg/m.  Physical Exam:   Physical Exam Vitals and nursing note reviewed.  Constitutional:      General: She is not in acute distress.    Appearance: She is well-developed. She is not ill-appearing or toxic-appearing.  Cardiovascular:     Rate and Rhythm: Normal rate and regular rhythm.     Pulses: Normal pulses.     Heart sounds: Normal heart sounds, S1 normal and S2 normal.  Pulmonary:     Effort: Pulmonary effort is normal.     Breath sounds: Normal breath sounds.  Skin:    General: Skin is warm and dry.  Neurological:     Mental Status: She is alert.     GCS: GCS eye subscore is 4. GCS verbal subscore is 5. GCS motor subscore is 6.  Psychiatric:        Speech: Speech normal.        Behavior: Behavior normal. Behavior is cooperative.     Assessment and Plan:   Benign essential hypertension Normotensive Continue lisinopril 40 mg daily Follow-up in 3-6 months  Generalized OA No red flags Continue oxycodone-acetaminophen 5-325 mg Declines pain management referral PDMP reviewed --- no red flags Follow-up in 3 months, sooner if concerns  Need for immunization against influenza Completed today   Inda Coke, PA-C

## 2022-01-18 NOTE — Patient Instructions (Addendum)
It was great to see you!  If you decide to go to pain management for any reason, let me know.  See you in 3 months.  Take care,  Inda Coke PA-C

## 2022-01-23 ENCOUNTER — Ambulatory Visit: Payer: Medicare HMO

## 2022-02-08 ENCOUNTER — Other Ambulatory Visit: Payer: Self-pay | Admitting: Physician Assistant

## 2022-02-17 ENCOUNTER — Telehealth: Payer: Self-pay | Admitting: Physician Assistant

## 2022-02-17 NOTE — Telephone Encounter (Signed)
..   Encourage patient to contact the pharmacy for refills or they can request refills through [facility]:  01/18/22  NEXT APPOINTMENT DATE:  MEDICATION: celecoxib (CELEBREX) 200 MG capsule   Is the patient out of medication?   PHARMACY:  [facility], [place] Phone: [phone]  Fax: [phone]      Let patient know to contact pharmacy at the end of the day to make sure medication is ready.  Please notify patient to allow 48-72 hours to process

## 2022-02-17 NOTE — Telephone Encounter (Signed)
Spoke to pt told her [facility] is not prescribing Celebrex for you. Pt said I know I want Aldona [facility] to take over the Rx cause her provider retired. Told her okay, Aldona Bar is out of the office I will have to check with her on Monday and get back to you. Pt verbalized understanding.

## 2022-02-20 MED ORDER — CELECOXIB 200 MG PO CAPS: 200.0000 mg | ORAL_CAPSULE | Freq: Every day | ORAL | 1 refills | Status: DC

## 2022-02-20 NOTE — Telephone Encounter (Signed)
Diana Arellano, pt would like you to take over her Celexa due to provider retired. Please advise.

## 2022-02-20 NOTE — Telephone Encounter (Signed)
Spoke to pt told her Rx for Celebrex 200 mg was sent to pharmacy. Pt verbalized understanding.

## 2022-04-06 ENCOUNTER — Telehealth: Payer: Self-pay | Admitting: Physician Assistant

## 2022-04-06 NOTE — Telephone Encounter (Signed)
Copied from CRM [id number]. Topic: Medicare AWV >> Apr 06, 2022 11:03 AM Gwenith Spitz wrote: Reason for CRM: LVM FOR PATIENT TO CALL [phone] SCHEDULE AWV WITH HEALTH COACH TINA

## 2022-04-12 ENCOUNTER — Encounter: Payer: Self-pay | Admitting: Physician Assistant

## 2022-04-12 ENCOUNTER — Ambulatory Visit (INDEPENDENT_AMBULATORY_CARE_PROVIDER_SITE_OTHER): Payer: Medicare HMO | Admitting: Physician Assistant

## 2022-04-12 VITALS — BP 120/70 | HR 95 | Temp 97.3°F | Ht 65.0 in | Wt 236.0 lb

## 2022-04-12 DIAGNOSIS — G8929 Other chronic pain: Secondary | ICD-10-CM

## 2022-04-12 DIAGNOSIS — M25511 Pain in right shoulder: Secondary | ICD-10-CM

## 2022-04-12 DIAGNOSIS — R21 Rash and other nonspecific skin eruption: Secondary | ICD-10-CM

## 2022-04-12 DIAGNOSIS — M48061 Spinal stenosis, lumbar region without neurogenic claudication: Secondary | ICD-10-CM

## 2022-04-12 DIAGNOSIS — I10 Essential (primary) hypertension: Secondary | ICD-10-CM

## 2022-04-12 DIAGNOSIS — M159 Polyosteoarthritis, unspecified: Secondary | ICD-10-CM | POA: Diagnosis not present

## 2022-04-12 LAB — LIPID PANEL
Cholesterol: 198 mg/dL (ref 0–200)
HDL: 56.2 mg/dL (ref >39.00)
NonHDL: 141.95
Total CHOL/HDL Ratio: 4
Triglycerides: 223 mg/dL — ABNORMAL HIGH (ref 0.0–149.0)
VLDL: 44.6 mg/dL — ABNORMAL HIGH (ref 0.0–40.0)

## 2022-04-12 LAB — CBC WITH DIFFERENTIAL/PLATELET
Basophils Absolute: 0.1 10*3/uL (ref 0.0–0.1)
Basophils Relative: 0.8 % (ref 0.0–3.0)
Eosinophils Absolute: 0.1 10*3/uL (ref 0.0–0.7)
Eosinophils Relative: 1.4 % (ref 0.0–5.0)
HCT: 41.7 % (ref 36.0–46.0)
Hemoglobin: 14.2 g/dL (ref 12.0–15.0)
Lymphocytes Relative: 26.8 % (ref 12.0–46.0)
Lymphs Abs: 2 10*3/uL (ref 0.7–4.0)
MCHC: 34 g/dL (ref 30.0–36.0)
MCV: 89.9 fl (ref 78.0–100.0)
Monocytes Absolute: 0.5 10*3/uL (ref 0.1–1.0)
Monocytes Relative: 5.9 % (ref 3.0–12.0)
Neutro Abs: 5 10*3/uL (ref 1.4–7.7)
Neutrophils Relative %: 65.1 % (ref 43.0–77.0)
Platelets: 237 10*3/uL (ref 150.0–400.0)
RBC: 4.64 Mil/uL (ref 3.87–5.11)
RDW: 13.8 % (ref 11.5–15.5)
WBC: 7.6 10*3/uL (ref 4.0–10.5)

## 2022-04-12 LAB — LDL CHOLESTEROL, DIRECT: Direct LDL: 116 mg/dL

## 2022-04-12 LAB — COMPREHENSIVE METABOLIC PANEL
ALT: 23 U/L (ref 0–35)
AST: 23 U/L (ref 0–37)
Albumin: 4.8 g/dL (ref 3.5–5.2)
Alkaline Phosphatase: 106 U/L (ref 39–117)
BUN: 14 mg/dL (ref 6–23)
CO2: 30 mEq/L (ref 19–32)
Calcium: 10.1 mg/dL (ref 8.4–10.5)
Chloride: 103 mEq/L (ref 96–112)
Creatinine, Ser: 0.79 mg/dL (ref 0.40–1.20)
GFR: 72.85 mL/min (ref >60.00)
Glucose, Bld: 113 mg/dL — ABNORMAL HIGH (ref 70–99)
Potassium: 4.2 mEq/L (ref 3.5–5.1)
Sodium: 143 mEq/L (ref 135–145)
Total Bilirubin: 0.4 mg/dL (ref 0.2–1.2)
Total Protein: 6.9 g/dL (ref 6.0–8.3)

## 2022-04-12 MED ORDER — OXYCODONE-ACETAMINOPHEN 5-325 MG PO TABS: 1.0000 | ORAL_TABLET | ORAL | 0 refills | Status: DC | PRN

## 2022-04-12 MED ORDER — TRIAMCINOLONE ACETONIDE 0.1 % EX CREA: TOPICAL_CREAM | CUTANEOUS | 2 refills | Status: DC

## 2022-04-12 NOTE — Patient Instructions (Signed)
It was great to see you!  These are the results of your shoulder MRI that you had 11 months ago: IMPRESSION: 1. Moderate rotator cuff tendinosis without tear. 2. Marked intra-articular biceps tendinosis. 3. Moderate acromioclavicular and mild glenohumeral osteoarthritis. 4. Findings which can be seen in the clinical setting of adhesive capsulitis.  Trial desitin or other zinc oxide formulation diaper cream and apply to the area of your skin rash to help heal/protect  Let's follow-up in 3 months, sooner if you have concerns.  If a referral was placed today --> you will be contacted for an appointment. Please note that routine referrals can sometimes take up to 3-4 weeks to process. Please call our office if you haven't heard anything after this time frame.  If blood work, urine studies, or any imaging was ordered today -->  we will release your results to you on your MyChart account (if you have chosen to sign up for this) with further instructions. You may see the results before I do, but when I review them I will send you a message with my report or have my staff call you if things need to be discussed. Please reply to my message with any questions.   Take care,  Jarold Motto PA-C

## 2022-04-12 NOTE — Progress Notes (Signed)
Diana Arellano is a 76 y.o. female here for a follow up of a pre-existing problem.  History of Present Illness:   Chief Complaint  Patient presents with  . Hypertension    HPI  HTN Currently taking lisinopril 40 mg. Patient is compliant with medication. At home blood pressure readings are: good. Patient denies chest pain, SOB, blurred vision, dizziness, unusual headaches, lower leg swelling. Denies excessive caffeine intake, stimulant usage, excessive alcohol intake, or increase in salt consumption. BP Readings from Last 5 Encounters:  04/12/22 120/70  01/18/22 130/70  10/21/21 130/74  10/07/21 (!) 151/78  07/20/21 130/80     Generalized OA Currently taking oxycodone-acetaminophen 5-325 mg QID prn. She is taking celebrex 200 mg daily and cymbalta 30 mg BID.  H/o bilateral TKA. Declined pain management referral at last visit with me on 01/18/22 and again today.   Right shoulder pain Denies any known injuries. Pain of the anterior shoulder. Worse with ROM such as when playing piano. Using oxycodone-acetaminophen 5-325 mg QID prn without improvement.  Previously had pain in posterior right shoulder- Tried Lidocaine injections with temporary relief in the past and later 6 sessions of dry needling without relief.  MRI Right Shoulder 05/05/2020: 1. Moderate rotator cuff tendinosis without tear. 2. Marked intra-articular biceps tendinosis. 3. Moderate acromioclavicular and mild glenohumeral osteoarthritis. 4. Findings which can be seen in the clinical setting of adhesive capsulitis.  Declined referral for ortho or pain management again today as she does not think that they could do anything to fix her pain. She would prefer to have work done on her back before considering intervention for her shoulder.  Past Medical History:  Diagnosis Date  . Cataract   . Complication of anesthesia    diff to start stream  . GERD (gastroesophageal reflux disease)    well controlled with  medication  . Headache(784.0)    from cervical radiculopathy  . Heart murmur    takes amoxicillin before dental appointments; was told at one point she had one     Social History   Tobacco Use  . Smoking status: Never  . Smokeless tobacco: Never  Vaping Use  . Vaping Use: Never used  Substance Use Topics  . Alcohol use: Yes    Alcohol/week: 10.0 standard drinks of alcohol    Types: 10 Shots of liquor per week    Comment: daily  . Drug use: No    Past Surgical History:  Procedure Laterality Date  . ABDOMINAL HYSTERECTOMY  08/1993  . BACK SURGERY  12/2011  . CARPAL TUNNEL RELEASE Right 2017  . CATARACT EXTRACTION W/ INTRAOCULAR LENS IMPLANT Right 08/20/2017  . CATARACT EXTRACTION W/ INTRAOCULAR LENS IMPLANT Left 09/10/2017  . 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    Family History  Problem Relation Age of Onset  . Alzheimer's disease Father   . Hypertension Sister   . Kidney disease Sister   . Diabetes Brother   . Heart attack Brother   . Prostate cancer Brother   . Alzheimer's disease Maternal Grandmother   . Alzheimer's disease Paternal Grandmother     Allergies  Allergen Reactions  . Benzalkonium Chloride Other (See Comments)    inflammed  . Neosporin [Neomycin-Bacitracin Zn-Polymyx] Itching    inflammation  . Other Itching    inflammation inflammation  . Sulfa Antibiotics Itching    inflammation    Current Medications:   Current Outpatient Medications:  .  acetaminophen (TYLENOL) 500 MG tablet, Take 1,000 mg  by mouth every 6 (six) hours as needed., Disp: , Rfl:  .  aspirin EC 81 MG tablet, Take 243 mg by mouth every morning. , Disp: , Rfl:  .  celecoxib (CELEBREX) 200 MG capsule, Take 1 capsule (200 mg total) by mouth daily., Disp: 90 capsule, Rfl: 1 .  Cholecalciferol (VITAMIN D-3) 25 MCG (1000 UT) CAPS, Take 1 capsule by mouth daily., Disp: , Rfl:  .  diclofenac Sodium (VOLTAREN) 1 % GEL, Apply 2 g topically 4 (four) times daily., Disp:  , Rfl:  .  diphenhydramine-acetaminophen (TYLENOL PM) 25-500 MG TABS tablet, Take 2 tablets by mouth at bedtime., Disp: , Rfl:  .  DULoxetine (CYMBALTA) 30 MG capsule, Take 1 capsule (30 mg total) by mouth every 12 (twelve) hours., Disp: 180 capsule, Rfl: 3 .  fenofibrate 54 MG tablet, TAKE 1 TABLET EVERY DAY, Disp: 90 tablet, Rfl: 3 .  lisinopril (ZESTRIL) 40 MG tablet, TAKE 1 TABLET AT BEDTIME, Disp: 90 tablet, Rfl: 3 .  nystatin ointment (MYCOSTATIN), Apply to affected area 1-2 times daily, Disp: 30 g, Rfl: 0 .  omeprazole (PRILOSEC) 40 MG capsule, TAKE 1 CAPSULE EVERY EVENING, Disp: 90 capsule, Rfl: 3 .  simvastatin (ZOCOR) 80 MG tablet, TAKE 1 TABLET(40 MG) BY MOUTH AT BEDTIME, Disp: 90 tablet, Rfl: 1 .  oxyCODONE-acetaminophen (PERCOCET/ROXICET) 5-325 MG tablet, Take 1 tablet by mouth every 4 (four) hours as needed for severe pain., Disp: 30 tablet, Rfl: 0 .  triamcinolone cream (KENALOG) 0.1 %, Apply to affected area 1-2 times daily, Disp: 45 g, Rfl: 2   Review of Systems:   Review of Systems  Eyes:  Negative for blurred vision.  Respiratory:  Negative for shortness of breath.   Cardiovascular:  Negative for chest pain and leg swelling.  Musculoskeletal:  Positive for joint pain and myalgias.  Neurological:  Negative for dizziness and headaches.    Vitals:   Vitals:   04/12/22 1305  BP: 120/70  Pulse: 95  Temp: (!) 97.3 F (36.3 C)  TempSrc: Temporal  SpO2: 97%  Weight: 236 lb (107 kg)  Height: 5\' 5"  (1.651 m)     Body mass index is 39.27 kg/m.  Physical Exam:   Physical Exam Vitals and nursing note reviewed.  Constitutional:      General: She is not in acute distress.    Appearance: She is well-developed. She is not ill-appearing or toxic-appearing.  Cardiovascular:     Rate and Rhythm: Normal rate and regular rhythm.     Pulses: Normal pulses.     Heart sounds: Normal heart sounds, S1 normal and S2 normal.  Pulmonary:     Effort: Pulmonary effort is normal.      Breath sounds: Normal breath sounds.  Skin:    General: Skin is warm and dry.  Neurological:     Mental Status: She is alert.     GCS: GCS eye subscore is 4. GCS verbal subscore is 5. GCS motor subscore is 6.  Psychiatric:        Speech: Speech normal.        Behavior: Behavior normal. Behavior is cooperative.     Assessment and Plan:   Benign essential hypertension Well controlled Continue lisinopril 40 mg daily Follow-up in 3 months, sooner if concerns  Generalized OA Overall controlled PDMP reviewed - no red flags - will continue percocet 5-325 mg rx Follow-up in 3 months for further fills Continue cymbalta 60 mg daily and celebrex 200 mg daily  Chronic right  shoulder pain Uncontrolled Recommend PT vs ortho - she refuses both Continue to monitor and provide assistance as able  Rash Refill triamcinolone Recommend topical zinc oxide, such as desitin Follow-up if ongoing  Spinal stenosis of lumbar region, unspecified whether neurogenic claudication present Referral to neurosurgery per her request  I,Alexis Herring,acting as a scribe for Jarold Motto, PA.,have documented all relevant documentation on the behalf of Jarold Motto, PA,as directed by  Jarold Motto, PA while in the presence of Jarold Motto, [place].  I, Jarold Motto, [place], have reviewed all documentation for this visit. The documentation on 04/12/22 for the exam, diagnosis, procedures, and orders are all accurate and complete.  Jarold Motto, PA-C

## 2022-05-11 DIAGNOSIS — M961 Postlaminectomy syndrome, not elsewhere classified: Secondary | ICD-10-CM | POA: Diagnosis not present

## 2022-05-11 DIAGNOSIS — M5136 Other intervertebral disc degeneration, lumbar region: Secondary | ICD-10-CM | POA: Diagnosis not present

## 2022-05-12 ENCOUNTER — Other Ambulatory Visit: Payer: Self-pay | Admitting: Neurological Surgery

## 2022-05-12 DIAGNOSIS — M961 Postlaminectomy syndrome, not elsewhere classified: Secondary | ICD-10-CM

## 2022-05-15 ENCOUNTER — Other Ambulatory Visit: Payer: Self-pay | Admitting: Physician Assistant

## 2022-05-30 ENCOUNTER — Telehealth: Payer: Self-pay | Admitting: Physician Assistant

## 2022-05-30 MED ORDER — TRIAMCINOLONE ACETONIDE 0.1 % EX CREA: TOPICAL_CREAM | CUTANEOUS | 0 refills | Status: DC

## 2022-05-30 NOTE — Telephone Encounter (Signed)
..   Encourage patient to contact the pharmacy for refills or they can request refills through [facility]:  Please schedule appointment if longer than 1 year  NEXT APPOINTMENT DATE: 06/2022   MEDICATION:triamcinolone cream (KENALOG) 0.1 %   Is the patient out of medication?  Yes   PHARMACY: [facility] [address]   Let patient know to contact pharmacy at the end of the day to make sure medication is ready.  Please notify patient to allow 48-72 hours to process   Patient called mail ins service pharmacy but they wont ship it on time. Patient would like a refill to be sent to her local pharmacy.

## 2022-05-30 NOTE — Telephone Encounter (Signed)
Spoke to pt told her Rx for Triamcinolone cream was sent to pharmacy. Pt verbalized understanding.

## 2022-06-09 ENCOUNTER — Ambulatory Visit
Admission: RE | Admit: 2022-06-09 | Discharge: 2022-06-09 | Disposition: A | Discharge status: Home / Self Care | Payer: Medicare HMO | Source: Ambulatory Visit | Attending: Neurological Surgery | Admitting: Neurological Surgery

## 2022-06-09 DIAGNOSIS — M961 Postlaminectomy syndrome, not elsewhere classified: Secondary | ICD-10-CM

## 2022-06-09 DIAGNOSIS — M545 Low back pain, unspecified: Secondary | ICD-10-CM | POA: Diagnosis not present

## 2022-06-09 DIAGNOSIS — M48061 Spinal stenosis, lumbar region without neurogenic claudication: Secondary | ICD-10-CM | POA: Diagnosis not present

## 2022-06-13 DIAGNOSIS — M961 Postlaminectomy syndrome, not elsewhere classified: Secondary | ICD-10-CM | POA: Diagnosis not present

## 2022-06-13 DIAGNOSIS — M5416 Radiculopathy, lumbar region: Secondary | ICD-10-CM | POA: Diagnosis not present

## 2022-06-26 DIAGNOSIS — M5416 Radiculopathy, lumbar region: Secondary | ICD-10-CM | POA: Diagnosis not present

## 2022-07-05 NOTE — Progress Notes (Signed)
Diana Arellano is a 77 y.o. female here for a follow up of a pre-existing problem.  History of Present Illness:   Chief Complaint  Patient presents with   Osteoarthritis    No longer wanting to be on oxycodone, makes her severely constipated   Hypertension    HPI  Generalized OA She is taking celebrex 200 mg daily and cymbalta 30 mg BID.  H/o bilateral TKA. Has stopped taking Oxycodone due to constipation. Taking Tylenol-Arthritis for neck and shoulder pain. Still having pain of the right anterior shoulder and neck. Had a lidocaine injection recently in her right shoulder that has helped.  Degenerative Disc Disease Lumbar Spine Consulting with neurosurgeon. Received spinal epidural injection two weeks ago. After injection she had flushing in her face, insomnia, itching. Pain returned in 1 week following injection. Only one previous epidural one year ago. Status post lumbar fusion at L4-5 in 2011.   BMI 38.37  She is having trouble with weight loss. Interested in trying weight loss medication. Lost 7 pounds between 01/18/22 and today. HGBA1c measured 5.7% today.  Past Medical History:  Diagnosis Date   Cataract    Complication of anesthesia    diff to start stream   GERD (gastroesophageal reflux disease)    well controlled with medication   Headache(784.0)    from cervical radiculopathy   Heart murmur    takes amoxicillin before dental appointments; was told at one point she had one     Social History   Tobacco Use   Smoking status: Never   Smokeless tobacco: Never  Vaping Use   Vaping Use: Never used  Substance Use Topics   Alcohol use: Yes    Alcohol/week: 10.0 standard drinks of alcohol    Types: 10 Shots of liquor per week    Comment: daily   Drug use: No    Past Surgical History:  Procedure Laterality Date   ABDOMINAL HYSTERECTOMY  08/1993   BACK SURGERY  12/2011   CARPAL TUNNEL RELEASE Right 2017   CATARACT EXTRACTION W/ INTRAOCULAR LENS IMPLANT  Right 08/20/2017   CATARACT EXTRACTION W/ INTRAOCULAR LENS IMPLANT Left 09/10/2017   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    Family History  Problem Relation Age of Onset   Alzheimer's disease Father    Hypertension Sister    Kidney disease Sister    Diabetes Brother    Heart attack Brother    Prostate cancer Brother    Heart attack Brother    Alzheimer's disease Maternal Grandmother    Alzheimer's disease Paternal Grandmother     Allergies  Allergen Reactions   Benzalkonium Chloride Other (See Comments)    inflammed   Neosporin [Neomycin-Bacitracin Zn-Polymyx] Itching    inflammation   Other Itching    inflammation inflammation   Sulfa Antibiotics Itching    inflammation    Current Medications:   Current Outpatient Medications:    acetaminophen (TYLENOL) 500 MG tablet, Take 1,000 mg by mouth every 6 (six) hours as needed., Disp: , Rfl:    aspirin EC 81 MG tablet, Take 243 mg by mouth every morning. , Disp: , Rfl:    celecoxib (CELEBREX) 200 MG capsule, Take 1 capsule (200 mg total) by mouth daily., Disp: 90 capsule, Rfl: 1   Cholecalciferol (VITAMIN D-3) 25 MCG (1000 UT) CAPS, Take 1 capsule by mouth daily., Disp: , Rfl:    diclofenac Sodium (VOLTAREN) 1 % GEL, Apply 2 g topically 4 (four) times daily., Disp: ,  Rfl:    diphenhydramine-acetaminophen (TYLENOL PM) 25-500 MG TABS tablet, Take 2 tablets by mouth at bedtime., Disp: , Rfl:    DULoxetine (CYMBALTA) 30 MG capsule, TAKE 1 CAPSULE EVERY 12 HOURS, Disp: 180 capsule, Rfl: 3   fenofibrate 54 MG tablet, TAKE 1 TABLET EVERY DAY, Disp: 90 tablet, Rfl: 3   lisinopril (ZESTRIL) 40 MG tablet, TAKE 1 TABLET AT BEDTIME, Disp: 90 tablet, Rfl: 3   nystatin ointment (MYCOSTATIN), Apply to affected area 1-2 times daily, Disp: 30 g, Rfl: 0   omeprazole (PRILOSEC) 40 MG capsule, TAKE 1 CAPSULE EVERY EVENING, Disp: 90 capsule, Rfl: 3   oxyCODONE-acetaminophen (PERCOCET/ROXICET) 5-325 MG tablet, Take 1 tablet by mouth  every 4 (four) hours as needed for severe pain., Disp: 30 tablet, Rfl: 0   simvastatin (ZOCOR) 80 MG tablet, TAKE 1 TABLET(40 MG) BY MOUTH AT BEDTIME, Disp: 90 tablet, Rfl: 1   triamcinolone cream (KENALOG) 0.1 %, Apply to affected area 1-2 times daily, Disp: 45 g, Rfl: 0   Review of Systems:   Review of Systems  Constitutional:  Negative for fever and malaise/fatigue.  HENT:  Negative for congestion.   Eyes:  Negative for blurred vision.  Respiratory:  Negative for cough and shortness of breath.   Cardiovascular:  Negative for chest pain, palpitations and leg swelling.  Gastrointestinal:  Positive for constipation. Negative for vomiting.  Musculoskeletal:  Positive for back pain (Lumbar), joint pain (Right shoulder) and neck pain.  Skin:  Negative for rash.  Neurological:  Negative for loss of consciousness and headaches.    Vitals:   Vitals:   07/12/22 1306  BP: 122/78  Pulse: 98  Resp: 16  Temp: 98.1 F (36.7 C)  TempSrc: Temporal  SpO2: 98%  Weight: 230 lb 9.6 oz (104.6 kg)  Height: 5\' 5"  (1.651 m)     Body mass index is 38.37 kg/m.  Physical Exam:   Physical Exam Vitals and nursing note reviewed.  Constitutional:      General: She is not in acute distress.    Appearance: She is well-developed. She is not ill-appearing or toxic-appearing.  Cardiovascular:     Rate and Rhythm: Normal rate and regular rhythm.     Pulses: Normal pulses.     Heart sounds: Normal heart sounds, S1 normal and S2 normal.  Pulmonary:     Effort: Pulmonary effort is normal.     Breath sounds: Normal breath sounds.  Skin:    General: Skin is warm and dry.  Neurological:     Mental Status: She is alert.     GCS: GCS eye subscore is 4. GCS verbal subscore is 5. GCS motor subscore is 6.  Psychiatric:        Speech: Speech normal.        Behavior: Behavior normal. Behavior is cooperative.    Results for orders placed or performed in visit on 07/12/22  POCT HgB A1C  Result Value Ref  Range   Hemoglobin A1C 5.3 4.0 - 5.6 %   HbA1c POC (<> result, manual entry)     HbA1c, POC (prediabetic range)     HbA1c, POC (controlled diabetic range)      Assessment and Plan:   Spinal stenosis of lumbar region, unspecified whether neurogenic claudication present Ongoing Will trial prn tramadol 50 mg vs continued prn percocet with more consistent bowel regimen Advised as follows: Also, as a precaution to anyone starting new medications that affect their serotonin, I just want to make sure that  you are aware of a rare, but serious side effect of starting another serotonin medication. Serotonin syndrome can cause: mental status changes (confusion/agitation), high blood pressure, increased heart rate, vomiting, diarrhea, seizures, and other symptoms. If you feel like this is happening, stop all meds and proceed to the ER. Again, this is rare, but can be serious if untreated. Follow-up in 3 months sooner if concerns  Obesity, unspecified classification, unspecified obesity type, unspecified whether serious comorbidity present POC A1c is 5.3% Will trial Zepbound but cautioned her that I do not think that this will be covered Encouraged healthy lifestyle efforts  I,Alexander Ruley,acting as a scribe for [employer], PA.,have documented all relevant documentation on the behalf of Inda Coke, PA,as directed by  Inda Coke, PA while in the presence of Inda Coke, [place].  I, Inda Coke, [place], have reviewed all documentation for this visit. The documentation on 07/13/22 for the exam, diagnosis, procedures, and orders are all accurate and complete.  Inda Coke, PA-C

## 2022-07-12 ENCOUNTER — Encounter: Payer: Self-pay | Admitting: Physician Assistant

## 2022-07-12 ENCOUNTER — Ambulatory Visit (INDEPENDENT_AMBULATORY_CARE_PROVIDER_SITE_OTHER): Payer: Medicare HMO | Admitting: Physician Assistant

## 2022-07-12 VITALS — BP 122/78 | HR 98 | Temp 98.1°F | Resp 16 | Ht 65.0 in | Wt 230.6 lb

## 2022-07-12 DIAGNOSIS — M48061 Spinal stenosis, lumbar region without neurogenic claudication: Secondary | ICD-10-CM | POA: Diagnosis not present

## 2022-07-12 DIAGNOSIS — E669 Obesity, unspecified: Secondary | ICD-10-CM

## 2022-07-12 NOTE — Patient Instructions (Signed)
It was great to see you!  I will be in touch with the plan after I talk with Dr Randol Kern -- give me a day or two  We will send in Zepbound to see if this is covered  Let's follow-up in 3 months, sooner if you have concerns.  Take care,  Inda Coke PA-C

## 2022-07-13 ENCOUNTER — Telehealth: Payer: Self-pay | Admitting: Physician Assistant

## 2022-07-13 LAB — POCT GLYCOSYLATED HEMOGLOBIN (HGB A1C): Hemoglobin A1C: 5.3 % (ref 4.0–5.6)

## 2022-07-13 NOTE — Telephone Encounter (Signed)
Please call patient  After review with physician regarding her pain management, the recommendation is to continue with the percocet and work on more consistent bowel regimen or we can trial low, infrequent dosage of tramadol.  I cannot guarantee tramadol will be less constipating  Also, as a precaution to anyone starting new medications that affect their serotonin (tramadol and cymbalta), I just want to make sure that you are aware of a rare, but serious side effect of starting another serotonin medication. Serotonin syndrome can cause: mental status changes (confusion/agitation), high blood pressure, increased heart rate, vomiting, diarrhea, seizures, and other symptoms. If you feel like this is happening, stop all meds and proceed to the ER. Again, this is rare, but can be serious if untreated.  Other option is to go to pain management  Please let me know what patient prefers to do  Diana Arellano

## 2022-07-13 NOTE — Telephone Encounter (Signed)
Left message on voicemail to call office.  

## 2022-07-14 NOTE — Telephone Encounter (Signed)
Please advise on pain management for patient.

## 2022-07-14 NOTE — Telephone Encounter (Signed)
Spoke to pt told her per Aldona Bar, After review with physician regarding her pain management, the recommendation is to continue with the percocet and work on more consistent bowel regimen or we can trial low, infrequent dosage of tramadol. I cannot guarantee tramadol will be less constipating. Pt verbalized understanding and said then she will not try Tramadol cause she can not deal with the constipation. Told pt other option is to go to pain management. Pt said she is not going anywhere unless it is in our office it is too far to go. Told her I do not thing we have a pain management in our office. Pt said you need to clarify with Samantha cause she said you did. Told her I will discuss with Aldona Bar and get back to you. Pt verbalized understanding.

## 2022-07-14 NOTE — Telephone Encounter (Signed)
Spoke to pt told her per Aldona Bar, Please let patient know that I did review her case with a doctor in our office that does have specialty training in pain management.   Unfortunately, he is not accepting patients for pain management any more in our office. He recommended that I refer her to pain management for more specialty care.   The recommendation is to continue the oxy with a bowel regimen or I can refer to pain management. Pt said she does not want to see pain management they do not anything except prescribe pills or patches. Told her okay.

## 2022-07-16 ENCOUNTER — Other Ambulatory Visit: Payer: Self-pay | Admitting: Physician Assistant

## 2022-07-20 DIAGNOSIS — Z6838 Body mass index (BMI) 38.0-38.9, adult: Secondary | ICD-10-CM | POA: Diagnosis not present

## 2022-07-20 DIAGNOSIS — M5416 Radiculopathy, lumbar region: Secondary | ICD-10-CM | POA: Diagnosis not present

## 2022-07-24 ENCOUNTER — Other Ambulatory Visit: Payer: Self-pay | Admitting: Neurological Surgery

## 2022-08-02 NOTE — Pre-Procedure Instructions (Addendum)
Surgical Instructions    Your procedure is scheduled on Friday, April 19th.  Report to [facility] Main Entrance "A" at 08:15 A.M., then check in with the Admitting office.  Call this number if you have problems the morning of surgery:  [phone]  If you have any questions prior to your surgery date call [phone]: Open Monday-Friday 8am-4pm If you experience any cold or flu symptoms such as cough, fever, chills, shortness of breath, etc. between now and your scheduled surgery, please notify us at the above number.     Remember:  Do not eat or drink after midnight the night before your surgery     Take these medicines the morning of surgery with A SIP OF WATER  DULoxetine (CYMBALTA)  fenofibrate    If needed: acetaminophen (TYLENOL)  oxymetazoline (AFRIN) nasal spray   Follow your surgeon's instructions on when to stop Aspirin.  If no instructions were given by your surgeon then you will need to call the office to get those instructions.     As of today, STOP taking any Aleve, Naproxen, Ibuprofen, celecoxib (CELEBREX), diclofenac Sodium (VOLTAREN) gel, Motrin, Advil, Goody's, BC's, all herbal medications, fish oil, and all vitamins.                     Do NOT Smoke (Tobacco/Vaping) for 24 hours prior to your procedure.  If you use a CPAP at night, you may bring your mask/headgear for your overnight stay.   Contacts, glasses, piercing's, hearing aid's, dentures or partials may not be worn into surgery, please bring cases for these belongings.    For patients admitted to the hospital, discharge time will be determined by your treatment team.   Patients discharged the day of surgery will not be allowed to drive home, and someone needs to stay with them for 24 hours.  SURGICAL WAITING ROOM VISITATION Patients having surgery or a procedure may have no more than 2 support people in the waiting area - these visitors may rotate.   Children under the age of 31 must have an  adult with them who is not the patient. If the patient needs to stay at the hospital during part of their recovery, the visitor guidelines for inpatient rooms apply. Pre-op nurse will coordinate an appropriate time for 1 support person to accompany patient in pre-op.  This support person may not rotate.   Please refer to the [facility] website for the visitor guidelines for Inpatients (after your surgery is over and you are in a regular room).      Day of Surgery: Take a shower with CHG soap. Do not wear jewelry or makeup Do not wear lotions, powders, perfumes, or deodorant. Do not bring valuables to the hospital. [facility] is not responsible for any belongings or valuables. Do not wear nail polish, gel polish, artificial nails, or any other type of covering on natural nails (fingers and toes) If you have artificial nails or gel coating that need to be removed by a nail salon, please have this removed prior to surgery. Artificial nails or gel coating may interfere with anesthesia's ability to adequately monitor your vital signs. Wear Clean/Comfortable clothing the morning of surgery Remember to brush your teeth WITH YOUR REGULAR TOOTHPASTE.   Please read over the following fact sheets that you were given.    If you received a COVID test during your pre-op visit  it is requested that you wear a mask when out in public, stay away from  anyone that may not be feeling well and notify your surgeon if you develop symptoms. If you have been in contact with anyone that has tested positive in the last 10 days please notify you surgeon.

## 2022-08-03 ENCOUNTER — Other Ambulatory Visit: Payer: Self-pay

## 2022-08-03 ENCOUNTER — Encounter (HOSPITAL_COMMUNITY)
Admission: RE | Admit: 2022-08-03 | Discharge: 2022-08-03 | Disposition: A | Discharge status: Home / Self Care | Payer: Medicare HMO | Source: Ambulatory Visit | Attending: Neurological Surgery | Admitting: Neurological Surgery

## 2022-08-03 ENCOUNTER — Encounter (HOSPITAL_COMMUNITY): Payer: Self-pay | Admitting: Anesthesiology

## 2022-08-03 ENCOUNTER — Encounter (HOSPITAL_COMMUNITY): Payer: Self-pay

## 2022-08-03 ENCOUNTER — Encounter (HOSPITAL_COMMUNITY): Payer: Self-pay | Admitting: Vascular Surgery

## 2022-08-03 VITALS — BP 136/71 | HR 88 | Temp 98.2°F | Resp 18 | Ht 65.0 in | Wt 234.6 lb

## 2022-08-03 DIAGNOSIS — Z6839 Body mass index (BMI) 39.0-39.9, adult: Secondary | ICD-10-CM | POA: Diagnosis not present

## 2022-08-03 DIAGNOSIS — I10 Essential (primary) hypertension: Secondary | ICD-10-CM | POA: Insufficient documentation

## 2022-08-03 DIAGNOSIS — M1990 Unspecified osteoarthritis, unspecified site: Secondary | ICD-10-CM | POA: Diagnosis not present

## 2022-08-03 DIAGNOSIS — E669 Obesity, unspecified: Secondary | ICD-10-CM | POA: Insufficient documentation

## 2022-08-03 DIAGNOSIS — K219 Gastro-esophageal reflux disease without esophagitis: Secondary | ICD-10-CM | POA: Insufficient documentation

## 2022-08-03 DIAGNOSIS — M48061 Spinal stenosis, lumbar region without neurogenic claudication: Secondary | ICD-10-CM | POA: Diagnosis not present

## 2022-08-03 DIAGNOSIS — I4510 Unspecified right bundle-branch block: Secondary | ICD-10-CM | POA: Insufficient documentation

## 2022-08-03 DIAGNOSIS — Z01818 Encounter for other preprocedural examination: Secondary | ICD-10-CM | POA: Insufficient documentation

## 2022-08-03 HISTORY — DX: Otitis media, unspecified, unspecified ear: H66.90

## 2022-08-03 HISTORY — DX: Spinal stenosis, lumbar region without neurogenic claudication: M48.061

## 2022-08-03 HISTORY — DX: Unspecified osteoarthritis, unspecified site: M19.90

## 2022-08-03 LAB — COMPREHENSIVE METABOLIC PANEL
ALT: 26 U/L (ref 0–44)
AST: 26 U/L (ref 15–41)
Albumin: 4 g/dL (ref 3.5–5.0)
Alkaline Phosphatase: 100 U/L (ref 38–126)
Anion gap: 10 (ref 5–15)
BUN: 15 mg/dL (ref 8–23)
CO2: 29 mmol/L (ref 22–32)
Calcium: 9.6 mg/dL (ref 8.9–10.3)
Chloride: 102 mmol/L (ref 98–111)
Creatinine, Ser: 0.84 mg/dL (ref 0.44–1.00)
GFR, Estimated: >60 mL/min (ref >60)
Glucose, Bld: 108 mg/dL — ABNORMAL HIGH (ref 70–99)
Potassium: 4.4 mmol/L (ref 3.5–5.1)
Sodium: 141 mmol/L (ref 135–145)
Total Bilirubin: 0.8 mg/dL (ref 0.3–1.2)
Total Protein: 6.8 g/dL (ref 6.5–8.1)

## 2022-08-03 LAB — CBC
HCT: 41.3 % (ref 36.0–46.0)
Hemoglobin: 13.5 g/dL (ref 12.0–15.0)
MCH: 30.7 pg (ref 26.0–34.0)
MCHC: 32.7 g/dL (ref 30.0–36.0)
MCV: 93.9 fL (ref 80.0–100.0)
Platelets: 235 10*3/uL (ref 150–400)
RBC: 4.4 MIL/uL (ref 3.87–5.11)
RDW: 13.2 % (ref 11.5–15.5)
WBC: 7.8 10*3/uL (ref 4.0–10.5)
nRBC: 0 % (ref 0.0–0.2)

## 2022-08-03 LAB — SURGICAL PCR SCREEN
MRSA, PCR: NEGATIVE
Staphylococcus aureus: NEGATIVE

## 2022-08-03 LAB — PROTIME-INR
INR: 1 (ref 0.8–1.2)
Prothrombin Time: 13.5 seconds (ref 11.4–15.2)

## 2022-08-03 NOTE — Progress Notes (Addendum)
Anesthesia APP Evaluation:  Case: [id number] Date/Time: 08/11/22 1000   Procedure: Laminectomy and Foraminotomy - L1-L2 - L2-L3 - bilateral (Bilateral: Back) - 3C   Anesthesia type: General   Pre-op diagnosis: Stenosis   Location: [facility] OR ROOM 21 / [facility] OR   Surgeons: Tia Alert, MD       DISCUSSION: Patient is a 77 year old female scheduled for the above procedure.  History includes never smoker, HTN, murmur (reported a "normal" echo at [facility] in [place] ~ 1998), GERD, osteoarthritis (bilateral TKAs), spinal surgery (L4-5 diskectomy/fusion 12/28/11). BMI is consistent with obesity.  Although she reports a normal echo around 1998 for possible murmur, she does not have the report, so her dentist requires her to take pre-dental amoxicillin as a precaution. She denied ever being told that she need SBE for surgeries. She only had one physician who heard a murmur and that was during a high school physical.   Patient and provider wore face masks during evaluation because patient reported "head cold" symptoms that started on Saturday 07/29/22. Her husband had symptoms first. She initially had a sore throat, runny nose, and a productive cough for a few days, but now primarily some residual dry cough, but improving. She denied SOB or wheezing. She denied fever or body aches. She took regular strength aspirin and cough medicine initially but not in the past 2 days. Voice has a slight nasal quality. Nose is a little red, but no drainage currently. Heart RRR, no murmur. Lungs clear. No pitting edema noted. No carotid bruits noted. She is on ASA 81 mg 2 tablets daily per her choice--she denied history of blood clots, CVA, coronary disease. She reported instructions to hold ASA for 5 days prior to surgery.   Her activity has been limited for about 6 years. She is able to plant flowers, make jewelry, and go up a few stairs to her house, but she had pain with standing and walking so is unable to walk long  distances (not doing to stores). She had 3 bouts of sciatica in 2022 and has had progressive back pain since.    URI symptoms started on 07/29/22, overall she feels it has been a mild course and appears to be improving, although still with intermittent coughing spells. She is not yet fully recovered, but suspect she may be so by her surgery date. Her surgery is only 8 days away. Ideally, would recommend postponing surgery for about 4 weeks after her symptoms started to allow time to recover and for symptoms to resolve for about two weeks prior to surgery. I discussed this with her, but also advised that we would need input from Dr. Yetta Barre since I am unsure of the urgency of the procedure. Fortunately, she does seem to be recovering and has normal lung sounds, O2 100%.   Discussed above with anesthesiologist Mal Amabile, MD. I have also discussed with Erie Noe at Dr. Yetta Barre' office. She will have Dr. Yetta Barre review for input regarding timing of surgery given above.    ADDENDUM 08/17/22 2:09 PM: Currently, patient's surgery is scheduled for 08/17/24. Dr. Yetta Barre had delayed surgery in order to give her more time to recover from URI symptoms. I called and spoke with her this morning. She indicated that she is back to baseline for the past 3-4 days, last cough medicine was 4 days ago. No fever or SOB. She is frustrated that case was already delayed. I have updated anesthesiologist Adonis Huguenin, MD and have spoken with Erie Noe at Dr.  Yetta BarreJones' office. Reportedly, Dr. Yetta BarreJones plans to call her after his office clinic today to reassess her symptoms related to her back and recent URI to decide what he feels is the best course of action regarding timing of her surgery.    VS: BP 136/71   Pulse 88   Temp 36.8 C (Oral)   Resp 18   Ht 5\' 5"  (1.651 m)   Wt 106.4 kg   SpO2 100%   BMI 39.04 kg/m   PROVIDERS: Jarold MottoWorley, Samantha, PA is PCP    LABS: Labs reviewed: Acceptable for surgery. A1c 07/13/22 5.3%.  (all labs  ordered are listed, but only abnormal results are displayed)  Labs Reviewed  COMPREHENSIVE METABOLIC PANEL - Abnormal; Notable for the following components:      Result Value   Glucose, Bld 108 (*)    All other components within normal limits  SURGICAL PCR SCREEN  PROTIME-INR  CBC    IMAGES: MRI L-spine 06/09/22: IMPRESSION: 1. Mild to moderate central canal stenosis and narrowing in the right subarticular recess at L1-2 appear worse than on the prior exam. 2. Moderate central canal stenosis at L2-3 appears worse than on the prior exam although the down turning left subarticular disc extrusion seen at this level on the prior exam has regressed.   EKG: EKG 08/03/22: NSR   CV: She reported a "[facility] [place] ~ [facility].  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   Complication of anesthesia    diff to start stream   GERD (gastroesophageal reflux disease)    well controlled with medication   Headache(784.0)    from cervical radiculopathy   Heart murmur    takes amoxicillin before dental appointments; was told at one point she had one   Hypertension    Lumbar stenosis    Osteoarthritis    as a child    Past Surgical History:  Procedure Laterality Date   ABDOMINAL HYSTERECTOMY  08/1993   BACK SURGERY  12/2011   CARPAL TUNNEL RELEASE Right 2017   CATARACT EXTRACTION W/ INTRAOCULAR LENS IMPLANT Right 08/20/2017   CATARACT EXTRACTION W/ INTRAOCULAR LENS IMPLANT Left 09/10/2017   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    MEDICATIONS:  acetaminophen (TYLENOL) 500 MG tablet   aspirin EC 81 MG tablet   celecoxib (CELEBREX) 200 MG capsule   Cholecalciferol (VITAMIN D-3) 25 MCG (1000 UT) CAPS   diclofenac Sodium (VOLTAREN) 1 % GEL   diphenhydramine-acetaminophen (TYLENOL PM) 25-500 MG TABS tablet   DULoxetine (CYMBALTA) 30 MG capsule   fenofibrate 54 MG tablet   lisinopril  (ZESTRIL) 40 MG tablet   nystatin ointment (MYCOSTATIN)   omeprazole (PRILOSEC) 40 MG capsule   oxyCODONE-acetaminophen (PERCOCET/ROXICET) 5-325 MG tablet   oxymetazoline (AFRIN) 0.05 % nasal spray   simvastatin (ZOCOR) 80 MG tablet   triamcinolone cream (KENALOG) 0.1 %   No current facility-administered medications for this encounter.   Shonna ChockAllison Romelle Muldoon, PA-C Surgical Short Stay/Anesthesiology [facility] Phone [phone] [facility] Phone [phone] 08/03/2022 3:17 PM

## 2022-08-03 NOTE — Progress Notes (Signed)
PCP - Jarold Motto Cardiologist - Denies  PPM/ICD - Denies  Chest x-ray - N/A EKG - N/A Stress Test - Denies ECHO - Years ago in [place] at [facility] Cardiac Cath - Denies  Sleep Study - NoOSA CPAP -   DM - Denies  Blood Thinner Instructions: N/A Aspirin Instructions: Per patient instructied to stop Aspirin 5 days prior.  ERAS Protcol -No  COVID TEST- Denies  Patient states she has a cold that began last Saturday. Has cough, sore throat, congestion that has dropped down into her chest.  Has not seen any provider for the cold.  Anesthesia review: Yes has history of heart mur mur and has cough multiple times during the appt. Allyson made aware and will come and evaluate patient.   Patient denies shortness of breath, fever, and chest pain at PAT appointment   All instructions explained to the patient, with a verbal understanding of the material. Patient agrees to go over the instructions while at home for a better understanding.  The opportunity to ask questions was provided.

## 2022-08-04 ENCOUNTER — Encounter: Payer: Self-pay | Admitting: Physician Assistant

## 2022-08-04 ENCOUNTER — Telehealth: Payer: Self-pay | Admitting: *Deleted

## 2022-08-04 NOTE — Telephone Encounter (Signed)
Pt calling scheduled for surgery next Friday 4/19 and was told to check with PCP about what medications she is allowed to take day of surgery. Pt said she was told she can take her Cymbalta, and stop vitamins, but she is not sure about her other medications. Told her Lelon Mast is out of the office till Tuesday and I will discuss with her and get back to you. Pt verbalized understanding.

## 2022-08-08 ENCOUNTER — Other Ambulatory Visit: Payer: Self-pay | Admitting: Physician Assistant

## 2022-08-08 MED ORDER — WEGOVY 0.25 MG/0.5ML ~~LOC~~ SOAJ: 0.2500 mg | SUBCUTANEOUS | 0 refills | Status: DC

## 2022-08-08 NOTE — Telephone Encounter (Signed)
Discussed pt with Lelon Mast, she said she is not comfortable telling pt what she can take or not take. Pt needs to contact surgeon or pre-op provider to see what she can take and not take.

## 2022-08-08 NOTE — Telephone Encounter (Signed)
Left message on voicemail to call office.  

## 2022-08-08 NOTE — Telephone Encounter (Signed)
Left detailed message on personal voicemail, I discussed your medications with Diana Arellano she said she is not comfortable telling you what you   take or not take. She said you need to contact surgeon or pre-op provider to see what she can take and not take.If you have any questions please call.

## 2022-08-17 NOTE — Anesthesia Preprocedure Evaluation (Deleted)
Anesthesia Evaluation    Airway        Dental   Pulmonary           Cardiovascular hypertension,      Neuro/Psych    GI/Hepatic   Endo/Other    Renal/GU      Musculoskeletal   Abdominal   Peds  Hematology   Anesthesia Other Findings   Reproductive/Obstetrics                             Anesthesia Physical Anesthesia Plan  ASA:   Anesthesia Plan:    Post-op Pain Management:    Induction:   PONV Risk Score and Plan:   Airway Management Planned:   Additional Equipment:   Intra-op Plan:   Post-operative Plan:   Informed Consent:   Plan Discussed with:   Anesthesia Plan Comments: (See PAT note written by Shonna Chock, PA-C.  )       Anesthesia Quick Evaluation

## 2022-08-17 NOTE — Progress Notes (Signed)
Attempted to call patient to inform her of surgery date of 08/18/2022 and arrival time of 1030.  Unable to get through, states "call cannot be completed."  Verified number and attempted numerous times.  Called Clark, her spouse and left a detailed message on his cell phone with date, time, NPO, and that Dr. Yetta Barre would be calling her tonight to check on her condition and make a decision as to whether to proceed with the surgery or not.

## 2022-08-18 ENCOUNTER — Ambulatory Visit (HOSPITAL_COMMUNITY): Admission: RE | Admit: 2022-08-18 | Payer: Medicare HMO | Source: Home / Self Care | Admitting: Neurological Surgery

## 2022-08-18 SURGERY — LUMBAR LAMINECTOMY/DECOMPRESSION MICRODISCECTOMY 2 LEVELS
Anesthesia: General | Site: Back | Laterality: Bilateral

## 2022-08-21 ENCOUNTER — Telehealth: Payer: Self-pay | Admitting: Physician Assistant

## 2022-08-21 NOTE — Telephone Encounter (Signed)
Patient returned your call. I proceeded to give her your office number, [phone], but she states this number won't go through when she called it three times. Patient would like you to call her back instead due to this.

## 2022-08-23 ENCOUNTER — Other Ambulatory Visit: Payer: Self-pay | Admitting: Neurological Surgery

## 2022-08-24 ENCOUNTER — Telehealth: Payer: Self-pay | Admitting: Physician Assistant

## 2022-08-24 NOTE — Telephone Encounter (Signed)
Contacted Monika Salk to schedule their annual wellness visit. Appointment made for 09/21/2022.  [facility] AWV TEAM Direct Dial [phone]

## 2022-08-30 DIAGNOSIS — H43812 Vitreous degeneration, left eye: Secondary | ICD-10-CM | POA: Diagnosis not present

## 2022-09-08 ENCOUNTER — Ambulatory Visit: Admit: 2022-09-08 | Payer: Medicare HMO | Admitting: Neurological Surgery

## 2022-09-08 DIAGNOSIS — M5416 Radiculopathy, lumbar region: Secondary | ICD-10-CM | POA: Diagnosis not present

## 2022-09-08 DIAGNOSIS — M48061 Spinal stenosis, lumbar region without neurogenic claudication: Secondary | ICD-10-CM | POA: Diagnosis not present

## 2022-09-08 SURGERY — LUMBAR LAMINECTOMY/DECOMPRESSION MICRODISCECTOMY 2 LEVELS
Anesthesia: General | Site: Back | Laterality: Bilateral

## 2022-10-13 ENCOUNTER — Ambulatory Visit: Payer: Medicare HMO | Admitting: Physician Assistant

## 2022-10-24 ENCOUNTER — Ambulatory Visit: Payer: Medicare HMO | Admitting: Physician Assistant

## 2022-12-11 ENCOUNTER — Other Ambulatory Visit: Payer: Self-pay | Admitting: Physician Assistant

## 2023-02-22 ENCOUNTER — Other Ambulatory Visit: Payer: Self-pay | Admitting: Physician Assistant

## 2023-03-08 ENCOUNTER — Other Ambulatory Visit: Payer: Self-pay | Admitting: Physician Assistant

## 2023-05-02 ENCOUNTER — Ambulatory Visit: Payer: Medicare Other

## 2023-05-02 VITALS — Ht 65.0 in | Wt 234.0 lb

## 2023-05-02 DIAGNOSIS — Z0000 Encounter for general adult medical examination without abnormal findings: Secondary | ICD-10-CM | POA: Diagnosis not present

## 2023-05-02 NOTE — Progress Notes (Signed)
 Subjective:   Diana Arellano is a 78 y.o. female who presents for Medicare Annual (Subsequent) preventive examination.  Visit Complete: Virtual I connected with  Rudell Rakers on 05/02/23 by a audio enabled telemedicine application and verified that I am speaking with the correct person using two identifiers.  Patient Location: Home  Provider Location: Office/Clinic  I discussed the limitations of evaluation and management by telemedicine. The patient expressed understanding and agreed to proceed.  Vital Signs: Because this visit was a virtual/telehealth visit, some criteria may be missing or patient reported. Any vitals not documented were not able to be obtained and vitals that have been documented are patient reported.  Cardiac Risk Factors include: advanced age (>37men, >44 women);hypertension;dyslipidemia     Objective:    Today's Vitals   05/02/23 1452  Weight: 234 lb (106.1 kg)  Height: 5' 5 (1.651 m)   Body mass index is 38.94 kg/m.     05/02/2023    2:56 PM 08/03/2022   11:29 AM 10/07/2021    4:47 PM 01/03/2014    8:15 PM 12/28/2011    6:46 AM 12/14/2011    2:09 PM 11/21/2011    1:57 PM  Advanced Directives  Does Patient Have a Medical Advance Directive? No No No No Patient has advance directive, copy not in chart Patient has advance directive, copy not in chart Patient has advance directive, copy not in chart  Type of Advance Directive      Healthcare Power of [place];Living will   Would patient like information on creating a medical advance directive? Yes (MAU/Ambulatory/Procedural Areas - Information given) Yes (MAU/Ambulatory/Procedural Areas - Information given) No - Patient declined      Pre-existing out of facility DNR order (yellow form or pink MOST form)     No      Current Medications (verified) Outpatient Encounter Medications as of 05/02/2023  Medication Sig   Semaglutide -Weight Management (WEGOVY ) 0.25 MG/0.5ML SOAJ Inject 0.25 mg into the skin once a  week.   acetaminophen  (TYLENOL ) 500 MG tablet Take 1,000 mg by mouth every 6 (six) hours as needed for moderate pain.   aspirin  EC 81 MG tablet Take 162 mg by mouth every morning.   celecoxib  (CELEBREX ) 200 MG capsule TAKE 1 CAPSULE EVERY DAY   Cholecalciferol (VITAMIN D-3) 25 MCG (1000 UT) CAPS Take 1,000 Units by mouth daily.   diclofenac Sodium (VOLTAREN) 1 % GEL Apply 2 g topically daily as needed (pain).   diphenhydramine -acetaminophen  (TYLENOL  PM) 25-500 MG TABS tablet Take 2 tablets by mouth at bedtime.   DULoxetine  (CYMBALTA ) 30 MG capsule TAKE 1 CAPSULE EVERY 12 HOURS   fenofibrate  54 MG tablet TAKE 1 TABLET EVERY DAY   lisinopril  (ZESTRIL ) 40 MG tablet TAKE 1 TABLET AT BEDTIME   nystatin  ointment (MYCOSTATIN ) Apply to affected area 1-2 times daily (Patient not taking: Reported on 08/01/2022)   omeprazole  (PRILOSEC) 40 MG capsule TAKE 1 CAPSULE EVERY EVENING   oxyCODONE -acetaminophen  (PERCOCET/ROXICET) 5-325 MG tablet Take 1 tablet by mouth every 4 (four) hours as needed for severe pain. (Patient not taking: Reported on 08/01/2022)   oxymetazoline (AFRIN) 0.05 % nasal spray Place 1 spray into both nostrils 2 (two) times daily as needed for congestion.   simvastatin  (ZOCOR ) 80 MG tablet TAKE 1 TABLET(40 MG) BY MOUTH AT BEDTIME   triamcinolone  cream (KENALOG ) 0.1 % Apply to affected area 1-2 times daily (Patient taking differently: Apply 1 Application topically daily as needed (irritation).)   No facility-administered encounter medications on file  as of 05/02/2023.    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   Complication of anesthesia    diff to start stream   GERD (gastroesophageal reflux disease)    well controlled with medication   Headache(784.0)    from cervical radiculopathy   Heart murmur     reported normal echo ~ 1998 in [place], does not have copy so as a precaution her dentist prescribes amoxicillin before visits   Hypertension    Lumbar stenosis    Osteoarthritis    as a child   Past Surgical History:  Procedure Laterality Date   ABDOMINAL HYSTERECTOMY  08/1993   BACK SURGERY  12/2011   CARPAL TUNNEL RELEASE Right 2017   CATARACT EXTRACTION W/ INTRAOCULAR LENS IMPLANT Right 08/20/2017   CATARACT EXTRACTION W/ INTRAOCULAR LENS IMPLANT Left 09/10/2017   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   Family History  Problem Relation Age of Onset   Alzheimer's disease Father    Hypertension Sister    Kidney disease Sister    Diabetes Brother    Heart attack Brother    Prostate cancer Brother    Heart attack Brother    Alzheimer's disease Maternal Grandmother    Alzheimer's disease Paternal Grandmother    Social History   Socioeconomic History   Marital status: Married    Spouse name: Radio Producer   Number of children: Not on file   Years of education: Not on file   Highest education level: Not on file  Occupational History   Not on file  Tobacco Use   Smoking status: Never   Smokeless tobacco: Never  Vaping Use   Vaping status: Never Used  Substance and Sexual Activity   Alcohol  use: Yes    Alcohol /week: 10.0 standard drinks of alcohol     Types: 10 Shots of liquor per week    Comment: daily   Drug use: No   Sexual activity: Not Currently  Other Topics Concern   Not on file  Social History Narrative   Retired -- librarian, academic   Lives with husband of 47 years   No children   From [place], [place]    Social Drivers of Health   Financial Resource Strain: Low Risk  (05/02/2023)   Overall Financial Resource Strain (CARDIA)    Difficulty of Paying Living Expenses: Not hard at all  Food Insecurity: No Food Insecurity (05/02/2023)   Hunger Vital Sign    Worried About Running Out of Food in the Last Year: Never true    Ran Out of Food in the Last Year:  Never true  Transportation Needs: No Transportation Needs (05/02/2023)   PRAPARE - Administrator, Civil Service (Medical): No    Lack of Transportation (Non-Medical): No  Physical Activity: Insufficiently Active (05/02/2023)   Exercise Vital Sign    Days of Exercise per Week: 3 days    Minutes of Exercise per Session: 30 min  Stress: No Stress Concern Present (05/02/2023)   [employer] of Occupational Health - Occupational Stress Questionnaire    Feeling of Stress : Not at all  Social Connections: Moderately Integrated (05/02/2023)   Social Connection and Isolation Panel [NHANES]    Frequency of Communication with Friends and Family: More than three times a week    Frequency of  Social Gatherings with Friends and Family: Three times a week    Attends Religious Services: 1 to 4 times per year    Active Member of Clubs or Organizations: No    Attends Banker Meetings: Never    Marital Status: Married    Tobacco Counseling Counseling given: Not Answered   Clinical Intake:  Pre-visit preparation completed: Yes  Pain : No/denies pain     Diabetes: No  How often do you need to have someone help you when you read instructions, pamphlets, or other written materials from your doctor or pharmacy?: 1 - Never  Interpreter Needed?: No  Information entered by :: Charmaine Bloodgood LPN   Activities of Daily Living    05/02/2023    2:56 PM 08/03/2022   11:35 AM  In your present state of health, do you have any difficulty performing the following activities:  Hearing? 0   Vision? 0   Difficulty concentrating or making decisions? 0   Walking or climbing stairs? 0   Dressing or bathing? 0   Doing errands, shopping? 0 1  Comment  husband does all errands  Preparing Food and eating ? N   Using the Toilet? N   In the past six months, have you accidently leaked urine? N   Do you have problems with loss of bowel control? N   Managing your Medications? N   Managing  your Finances? N   Housekeeping or managing your Housekeeping? N     Patient Care Team: Job Lukes, [place] as PCP - General (Physician Assistant) Roche, Ozell PARAS, PA-C as Consulting Physician (Physician Assistant)  Indicate any recent Medical Services you may have received from other than [facility] providers in the past year (date may be approximate).     Assessment:   This is a routine wellness examination for Diana Arellano.  Hearing/Vision screen Hearing Screening - Comments:: Denies hearing difficulties   Vision Screening - Comments:: Wears rx glasses - up to date with routine eye exams with Dr. Lauraine Fass     Goals Addressed             This Visit's Progress    Remain active and independent        Depression Screen    05/02/2023    2:55 PM 07/12/2022    1:07 PM 02/27/2020   10:53 AM  PHQ 2/9 Scores  PHQ - 2 Score 0 0   PHQ- 9 Score  2   Exception Documentation   Patient refusal    Fall Risk    05/02/2023    2:56 PM 07/12/2022    1:07 PM  Fall Risk   Falls in the past year? 1 0  Number falls in past yr: 1 0  Injury with Fall? 0 0  Risk for fall due to : History of fall(s);Impaired balance/gait;Impaired mobility No Fall Risks  Follow up Education provided;Falls prevention discussed;Falls evaluation completed     MEDICARE RISK AT HOME: Medicare Risk at Home Any stairs in or around the home?: No If so, are there any without handrails?: No Home free of loose throw rugs in walkways, pet beds, electrical cords, etc?: Yes Adequate lighting in your home to reduce risk of falls?: Yes Life alert?: No Use of a cane, walker or w/c?: No Grab bars in the bathroom?: Yes Shower chair or bench in shower?: Yes Elevated toilet seat or a handicapped toilet?: Yes  TIMED UP AND GO:  Was the test performed?  No  Cognitive Function:        05/02/2023    2:56 PM  6CIT Screen  What Year? 0 points  What month? 0 points  What time? 0 points  Count back from 20 0 points   Months in reverse 0 points  Repeat phrase 0 points  Total Score 0 points    Immunizations Immunization History  Administered Date(s) Administered   Fluad Quad(high Dose 65+) 03/08/2021, 01/18/2022   Influenza Split 04/24/2010   Influenza, High Dose Seasonal PF 01/23/2020   Influenza-Unspecified 02/02/2023   Moderna Sars-Covid-2 Vaccination 06/07/2019, 07/05/2019, 02/17/2020   Pneumococcal Conjugate-13 07/08/2013   Pneumococcal Polysaccharide-23 12/29/2011   Tdap 02/13/2013   Unspecified SARS-COV-2 Vaccination 02/02/2023   Zoster, Live 10/12/2011    TDAP status: Due, Education has been provided regarding the importance of this vaccine. Advised may receive this vaccine at local pharmacy or Health Dept. Aware to provide a copy of the vaccination record if obtained from local pharmacy or Health Dept. Verbalized acceptance and understanding.  Flu Vaccine status: Up to date  Pneumococcal vaccine status: Up to date  Covid-19 vaccine status: Information provided on how to obtain vaccines.   Qualifies for Shingles Vaccine? Yes   Zostavax completed Yes   Shingrix Completed?: No.    Education has been provided regarding the importance of this vaccine. Patient has been advised to call insurance company to determine out of pocket expense if they have not yet received this vaccine. Advised may also receive vaccine at local pharmacy or Health Dept. Verbalized acceptance and understanding.  Screening Tests Health Maintenance  Topic Date Due   Hepatitis C Screening  Never done   Zoster Vaccines- Shingrix (1 of 2) 03/23/1996   DEXA SCAN  Never done   DTaP/Tdap/Td (2 - Td or Tdap) 02/14/2023   Medicare Annual Wellness (AWV)  05/01/2024   Pneumonia Vaccine 39+ Years old  Completed   INFLUENZA VACCINE  Completed   COVID-19 Vaccine  Completed   HPV VACCINES  Aged Out   Colonoscopy  Discontinued    Health Maintenance  Health Maintenance Due  Topic Date Due   Hepatitis C Screening  Never  done   Zoster Vaccines- Shingrix (1 of 2) 03/23/1996   DEXA SCAN  Never done   DTaP/Tdap/Td (2 - Td or Tdap) 02/14/2023    Colorectal cancer screening: No longer required.   Mammogram status: No longer required due to age and preference .  Bone Density status: Patient declines   Lung Cancer Screening: (Low Dose CT Chest recommended if Age 14-80 years, 20 pack-year currently smoking OR have quit w/in 15years.) does not qualify.   Lung Cancer Screening Referral: n/a  Additional Screening:  Hepatitis C Screening: does qualify  Vision Screening: Recommended annual ophthalmology exams for early detection of glaucoma and other disorders of the eye. Is the patient up to date with their annual eye exam?  Yes  Who is the provider or what is the name of the office in which the patient attends annual eye exams? Dr. Lauraine Fass  If pt is not established with a provider, would they like to be referred to a provider to establish care? No .   Dental Screening: Recommended annual dental exams for proper oral hygiene  Community Resource Referral / Chronic Care Management: CRR required this visit?  No   CCM required this visit?  No     Plan:     I have personally reviewed and noted the following in the patient's chart:  Medical and social history Use of alcohol , tobacco or illicit drugs  Current medications and supplements including opioid prescriptions. Patient is not currently taking opioid prescriptions. Functional ability and status Nutritional status Physical activity Advanced directives List of other physicians Hospitalizations, surgeries, and ER visits in previous 12 months Vitals Screenings to include cognitive, depression, and falls Referrals and appointments  In addition, I have reviewed and discussed with patient certain preventive protocols, quality metrics, and best practice recommendations. A written personalized care plan for preventive services as well as general  preventive health recommendations were provided to patient.     Lavelle Pfeiffer [place], [place]   11/22/7972   After Visit Summary: (MyChart) Due to this being a telephonic visit, the after visit summary with patients personalized plan was offered to patient via MyChart   Nurse Notes: No concerns at this time

## 2023-05-02 NOTE — Patient Instructions (Signed)
 Diana Arellano , Thank you for taking time to come for your Medicare Wellness Visit. I appreciate your ongoing commitment to your health goals. Please review the following plan we discussed and let me know if I can assist you in the future.   Referrals/Orders/Follow-Ups/Clinician Recommendations: Aim for 30 minutes of exercise or brisk walking, 6-8 glasses of water, and 5 servings of fruits and vegetables each day.  This is a list of the screening recommended for you and due dates:  Health Maintenance  Topic Date Due   Hepatitis C Screening  Never done   Zoster (Shingles) Vaccine (1 of 2) 03/23/1996   DEXA scan (bone density measurement)  Never done   DTaP/Tdap/Td vaccine (2 - Td or Tdap) 02/14/2023   Medicare Annual Wellness Visit  05/01/2024   Pneumonia Vaccine  Completed   Flu Shot  Completed   COVID-19 Vaccine  Completed   HPV Vaccine  Aged Out   Colon Cancer Screening  Discontinued    Advanced directives: (ACP Link)Information on Advanced Care Planning can be found at [facility]  Secretary of State Advance Health Care Directives Advance Health Care Directives ([email or web address]   Next Medicare Annual Wellness Visit scheduled for next year: Yes

## 2023-05-03 ENCOUNTER — Telehealth: Payer: Self-pay | Admitting: Physician Assistant

## 2023-05-03 NOTE — Telephone Encounter (Signed)
 Called to schedule ov with provider to discuss [facility] and any other concerns. Pt refused stating she needs to stay open for a future vacation anytime between now and April. She will come in after that time.

## 2023-05-08 ENCOUNTER — Other Ambulatory Visit: Payer: Self-pay | Admitting: Physician Assistant

## 2023-05-08 NOTE — Telephone Encounter (Signed)
 Noted.

## 2023-05-17 ENCOUNTER — Other Ambulatory Visit: Payer: Self-pay | Admitting: Physician Assistant

## 2023-05-25 ENCOUNTER — Telehealth: Payer: Self-pay | Admitting: *Deleted

## 2023-05-25 MED ORDER — DULOXETINE HCL 30 MG PO CPEP: 30.0000 mg | ORAL_CAPSULE | Freq: Two times a day (BID) | ORAL | 2 refills | Status: DC

## 2023-05-25 NOTE — Telephone Encounter (Signed)
Spoke to pt told her Cymbalta was sent to [facility] pharmacy on 05/14/2023. Pt said she is no longer with [facility], now using [facility] mail order. I looked in chart and [facility] has shown up for mail order pharmacy now. Pt said she needs her Cymbalta sent to [facility] for now., she has a lot of extra of her other medications. Told her okay I will send it there, when you need refills on your other medications let me know and I will send to new mail order. Pt verbalized understanding.

## 2023-05-25 NOTE — Telephone Encounter (Signed)
Copied from CRM [id number]. Topic: General - Other >> May 24, 2023  2:03 PM [place] A wrote: Reason for CRM: Patient called said she needs someone from the office to call [employer] [facility] -please call [facility] [phone] to have a list of her medication sent over- Patient said [facility] said they needs to be a new prescription for Duloxetine so that she can pick that up from [facility]

## 2023-06-14 ENCOUNTER — Other Ambulatory Visit: Payer: Self-pay | Admitting: *Deleted

## 2023-06-14 MED ORDER — SIMVASTATIN 80 MG PO TABS: ORAL_TABLET | ORAL | 0 refills | Status: DC

## 2023-06-14 MED ORDER — DULOXETINE HCL 30 MG PO CPEP: 30.0000 mg | ORAL_CAPSULE | Freq: Two times a day (BID) | ORAL | 0 refills | Status: DC

## 2023-06-14 MED ORDER — LISINOPRIL 40 MG PO TABS: ORAL_TABLET | ORAL | 0 refills | Status: DC

## 2023-06-14 MED ORDER — OMEPRAZOLE 40 MG PO CPDR: DELAYED_RELEASE_CAPSULE | ORAL | 0 refills | Status: DC

## 2023-06-14 MED ORDER — FENOFIBRATE 54 MG PO TABS: ORAL_TABLET | ORAL | 0 refills | Status: DC

## 2023-06-14 MED ORDER — TRIAMCINOLONE ACETONIDE 0.1 % EX CREA: TOPICAL_CREAM | CUTANEOUS | 1 refills | Status: DC

## 2023-07-30 ENCOUNTER — Other Ambulatory Visit: Payer: Self-pay | Admitting: Physician Assistant

## 2023-08-15 ENCOUNTER — Other Ambulatory Visit: Payer: Self-pay | Admitting: Physician Assistant

## 2023-10-11 ENCOUNTER — Other Ambulatory Visit: Payer: Self-pay | Admitting: Physician Assistant

## 2023-10-12 ENCOUNTER — Other Ambulatory Visit: Payer: Self-pay | Admitting: *Deleted

## 2023-10-12 MED ORDER — DULOXETINE HCL 30 MG PO CPEP: 30.0000 mg | ORAL_CAPSULE | Freq: Two times a day (BID) | ORAL | 0 refills | Status: DC

## 2023-10-17 ENCOUNTER — Other Ambulatory Visit: Payer: Self-pay | Admitting: Physician Assistant

## 2023-10-23 ENCOUNTER — Other Ambulatory Visit: Payer: Self-pay | Admitting: Physician Assistant

## 2023-11-09 ENCOUNTER — Other Ambulatory Visit: Payer: Self-pay | Admitting: Physician Assistant

## 2023-11-29 ENCOUNTER — Ambulatory Visit: Admitting: Physician Assistant

## 2023-12-10 ENCOUNTER — Encounter: Payer: Self-pay | Admitting: Physician Assistant

## 2023-12-10 ENCOUNTER — Ambulatory Visit (INDEPENDENT_AMBULATORY_CARE_PROVIDER_SITE_OTHER): Admitting: Physician Assistant

## 2023-12-10 VITALS — BP 130/80 | HR 78 | Temp 97.2°F | Ht 65.0 in | Wt 224.0 lb

## 2023-12-10 DIAGNOSIS — K2100 Gastro-esophageal reflux disease with esophagitis, without bleeding: Secondary | ICD-10-CM

## 2023-12-10 DIAGNOSIS — M4802 Spinal stenosis, cervical region: Secondary | ICD-10-CM | POA: Diagnosis not present

## 2023-12-10 DIAGNOSIS — I10 Essential (primary) hypertension: Secondary | ICD-10-CM | POA: Diagnosis not present

## 2023-12-10 DIAGNOSIS — E782 Mixed hyperlipidemia: Secondary | ICD-10-CM | POA: Diagnosis not present

## 2023-12-10 LAB — CBC WITH DIFFERENTIAL/PLATELET
Basophils Absolute: 0 K/uL (ref 0.0–0.1)
Basophils Relative: 0.6 % (ref 0.0–3.0)
Eosinophils Absolute: 0.1 K/uL (ref 0.0–0.7)
Eosinophils Relative: 2.2 % (ref 0.0–5.0)
HCT: 40.6 % (ref 36.0–46.0)
Hemoglobin: 13.7 g/dL (ref 12.0–15.0)
Lymphocytes Relative: 28.7 % (ref 12.0–46.0)
Lymphs Abs: 1.8 K/uL (ref 0.7–4.0)
MCHC: 33.7 g/dL (ref 30.0–36.0)
MCV: 91 fl (ref 78.0–100.0)
Monocytes Absolute: 0.4 K/uL (ref 0.1–1.0)
Monocytes Relative: 5.9 % (ref 3.0–12.0)
Neutro Abs: 3.9 K/uL (ref 1.4–7.7)
Neutrophils Relative %: 62.6 % (ref 43.0–77.0)
Platelets: 218 K/uL (ref 150.0–400.0)
RBC: 4.46 Mil/uL (ref 3.87–5.11)
RDW: 13.6 % (ref 11.5–15.5)
WBC: 6.2 K/uL (ref 4.0–10.5)

## 2023-12-10 LAB — COMPREHENSIVE METABOLIC PANEL WITH GFR
ALT: 12 U/L (ref 0–35)
AST: 14 U/L (ref 0–37)
Albumin: 4.4 g/dL (ref 3.5–5.2)
Alkaline Phosphatase: 79 U/L (ref 39–117)
BUN: 18 mg/dL (ref 6–23)
CO2: 30 meq/L (ref 19–32)
Calcium: 9.4 mg/dL (ref 8.4–10.5)
Chloride: 103 meq/L (ref 96–112)
Creatinine, Ser: 0.66 mg/dL (ref 0.40–1.20)
GFR: 84.44 mL/min (ref >60.00)
Glucose, Bld: 96 mg/dL (ref 70–99)
Potassium: 3.8 meq/L (ref 3.5–5.1)
Sodium: 142 meq/L (ref 135–145)
Total Bilirubin: 0.3 mg/dL (ref 0.2–1.2)
Total Protein: 6.7 g/dL (ref 6.0–8.3)

## 2023-12-10 LAB — LIPID PANEL
Cholesterol: 186 mg/dL (ref 0–200)
HDL: 53.7 mg/dL (ref >39.00)
LDL Cholesterol: 99 mg/dL (ref 0–99)
NonHDL: 132.54
Total CHOL/HDL Ratio: 3
Triglycerides: 168 mg/dL — ABNORMAL HIGH (ref 0.0–149.0)
VLDL: 33.6 mg/dL (ref 0.0–40.0)

## 2023-12-10 MED ORDER — FENOFIBRATE 54 MG PO TABS: 54.0000 mg | ORAL_TABLET | Freq: Every day | ORAL | 1 refills | Status: AC

## 2023-12-10 MED ORDER — DULOXETINE HCL 30 MG PO CPEP: 30.0000 mg | ORAL_CAPSULE | Freq: Two times a day (BID) | ORAL | 1 refills | Status: DC

## 2023-12-10 MED ORDER — OMEPRAZOLE 40 MG PO CPDR: 40.0000 mg | DELAYED_RELEASE_CAPSULE | Freq: Every evening | ORAL | 1 refills | Status: AC

## 2023-12-10 MED ORDER — CELECOXIB 200 MG PO CAPS: 200.0000 mg | ORAL_CAPSULE | Freq: Every day | ORAL | 1 refills | Status: AC

## 2023-12-10 MED ORDER — LISINOPRIL 40 MG PO TABS: 40.0000 mg | ORAL_TABLET | Freq: Every day | ORAL | 1 refills | Status: AC

## 2023-12-10 MED ORDER — TRIAMCINOLONE ACETONIDE 0.1 % EX CREA: TOPICAL_CREAM | CUTANEOUS | 1 refills | Status: AC

## 2023-12-10 MED ORDER — SIMVASTATIN 80 MG PO TABS: 80.0000 mg | ORAL_TABLET | Freq: Every day | ORAL | 1 refills | Status: AC

## 2023-12-10 NOTE — Patient Instructions (Signed)
It was great to see you!  Let's follow-up in 6 months, sooner if you have concerns.  Take care,  Jarold MottoSamantha Worley PA-C

## 2023-12-10 NOTE — Progress Notes (Signed)
 Diana Arellano is a 78 y.o. female here for a follow up of a pre-existing problem.  History of Present Illness:   Chief Complaint  Patient presents with   Medical Management of Chronic Issues    Pt here for f/u on Hypertension, Spinal stenosis of lumbar region.    Discussed the use of AI scribe software for clinical note transcription with the patient, who gave verbal consent to proceed.  History of Present Illness Diana Arellano is a 78 year old female who presents for follow up of chronic medical issues.  She experiences daily headaches associated with neck pain, which are alleviated by a heating pad and neck roll. She attributes these symptoms to prolonged computer use and notes audible 'crunching' sounds in her neck, which she associates with arthritis.  Her current medications include Celebrex  200 mg daily for arthritis pain, taken after breakfast to prevent gastrointestinal discomfort, and 40 mg omeprazole  at night. She previously discontinued hydrocodone  due to severe constipation. She takes Cymbalta  30 mg twice daily for pain management but is uncertain of its effectiveness.  She underwent knee replacement surgery in 2008, resulting in nerve damage that affects her balance and causes her to drag her right foot, leading to falls. She experienced a fall several months ago, injuring her arm. Reports there is no any ongoing pain.  She is taking lisinopril  40 mg daily for her blood pressure which is well controlled today.  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   Complication of anesthesia    diff to start stream   GERD (gastroesophageal reflux disease)    well controlled with medication   Headache(784.0)    from cervical radiculopathy   Heart murmur    reported normal echo ~ 1998 in [place], does not have copy so as a precaution her dentist  prescribes amoxicillin before visits   Hypertension    Lumbar stenosis    Osteoarthritis    as a child     Social History   Tobacco Use   Smoking status: Never   Smokeless tobacco: Never  Vaping Use   Vaping status: Never Used  Substance Use Topics   Alcohol  use: Yes    Alcohol /week: 10.0 standard drinks of alcohol     Types: 10 Shots of liquor per week    Comment: daily   Drug use: No    Past Surgical History:  Procedure Laterality Date   ABDOMINAL HYSTERECTOMY  08/1993   BACK SURGERY  12/2011   CARPAL TUNNEL RELEASE Right 2017   CATARACT EXTRACTION W/ INTRAOCULAR LENS IMPLANT Right 08/20/2017   CATARACT EXTRACTION W/ INTRAOCULAR LENS IMPLANT Left 09/10/2017   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    Family History  Problem Relation Age of Onset   Alzheimer's disease Father    Hypertension Sister    Kidney disease Sister    Diabetes Brother    Heart attack Brother    Prostate cancer Brother    Heart attack Brother    Alzheimer's disease Maternal Grandmother    Alzheimer's disease Paternal Grandmother     Allergies  Allergen Reactions   Benzalkonium Chloride Other (See Comments)    inflammed   Neosporin [Neomycin-Bacitracin Zn-Polymyx] Itching    inflammation   Sulfa Antibiotics Itching    inflammation  Current Medications:   Current Outpatient Medications:    acetaminophen  (TYLENOL ) 500 MG tablet, Take 1,000 mg by mouth every 6 (six) hours as needed for moderate pain., Disp: , Rfl:    aspirin  EC 81 MG tablet, Take 162 mg by mouth every morning., Disp: , Rfl:    celecoxib  (CELEBREX ) 200 MG capsule, TAKE 1 CAPSULE BY MOUTH DAILY, Disp: 100 capsule, Rfl: 0   Cholecalciferol (VITAMIN D-3) 25 MCG (1000 UT) CAPS, Take 1,000 Units by mouth daily., Disp: , Rfl:    diclofenac Sodium (VOLTAREN) 1 % GEL, Apply 2 g topically daily as needed (pain)., Disp: , Rfl:    diphenhydramine -acetaminophen  (TYLENOL  PM) 25-500 MG TABS tablet, Take 2 tablets by  mouth at bedtime., Disp: , Rfl:    DULoxetine  (CYMBALTA ) 30 MG capsule, Take 1 capsule (30 mg total) by mouth 2 (two) times daily., Disp: 200 capsule, Rfl: 0   fenofibrate  54 MG tablet, TAKE 1 TABLET BY MOUTH DAILY, Disp: 100 tablet, Rfl: 0   lisinopril  (ZESTRIL ) 40 MG tablet, TAKE 1 TABLET BY MOUTH AT  BEDTIME, Disp: 100 tablet, Rfl: 0   omeprazole  (PRILOSEC) 40 MG capsule, TAKE 1 CAPSULE BY MOUTH EVERY  EVENING, Disp: 100 capsule, Rfl: 0   oxymetazoline (AFRIN) 0.05 % nasal spray, Place 1 spray into both nostrils 2 (two) times daily as needed for congestion., Disp: , Rfl:    simvastatin  (ZOCOR ) 80 MG tablet, TAKE 1 TABLET BY MOUTH AT  BEDTIME, Disp: 100 tablet, Rfl: 0   triamcinolone  cream (KENALOG ) 0.1 %, APPLY TOPICALLY TO THE AFFECTED  AREA 1 TO 2 TIMES DAILY, Disp: 60 g, Rfl: 0   Review of Systems:   Negative unless otherwise specified per HPI.  Vitals:   Vitals:   12/10/23 1318  BP: 130/80  Pulse: 78  Temp: (!) 97.2 F (36.2 C)  TempSrc: Temporal  SpO2: 99%  Weight: 224 lb (101.6 kg)  Height: 5' 5 (1.651 m)     Body mass index is 37.28 kg/m.  Physical Exam:   Physical Exam Vitals and nursing note reviewed.  Constitutional:      General: She is not in acute distress.    Appearance: She is well-developed. She is not ill-appearing or toxic-appearing.  Cardiovascular:     Rate and Rhythm: Normal rate and regular rhythm.     Pulses: Normal pulses.     Heart sounds: Normal heart sounds, S1 normal and S2 normal.  Pulmonary:     Effort: Pulmonary effort is normal.     Breath sounds: Normal breath sounds.  Skin:    General: Skin is warm and dry.  Neurological:     Mental Status: She is alert.     GCS: GCS eye subscore is 4. GCS verbal subscore is 5. GCS motor subscore is 6.  Psychiatric:        Speech: Speech normal.        Behavior: Behavior normal. Behavior is cooperative.     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 - Continue current management with heating pad and neck roll. - Continue Celecoxib  200 mg daily. - Continue duloxetine  30 mg twice daily.  Gastroesophageal reflux disease GERD managed with omeprazole  40 mg daily. Discussed potential switch to pantoprazole  if symptoms persist. Prefers to monitor symptoms and use Tums as needed. -  Continue omeprazole  40 mg daily. - Use Tums as needed for breakthrough heartburn. - Consider switching to pantoprazole  if symptoms persist.  Hyperlipidemia Hyperlipidemia managed with simvastatin  80 mg daily and fenofibrate  54 mg daily. No new concerns reported. - Continue simvastatin  and fenofibrate  as prescribed.  Hypertension Hypertension well-controlled with lisinopril  40 mg daily. Blood pressure readings are stable. - Continue lisinopril  as prescribed.       Lucie Buttner, PA-C

## 2023-12-11 ENCOUNTER — Ambulatory Visit: Payer: Self-pay | Admitting: Physician Assistant

## 2024-05-05 ENCOUNTER — Ambulatory Visit: Payer: Medicare Other

## 2024-05-19 ENCOUNTER — Other Ambulatory Visit: Payer: Self-pay | Admitting: Physician Assistant

## 2024-06-11 ENCOUNTER — Encounter: Admitting: Physician Assistant
